# Patient Record
Sex: Female | Born: 1975 | Race: White | Hispanic: Yes | Marital: Married | Smoking: Former smoker
Health system: Southern US, Community
[De-identification: ages and names within clinical notes are randomized; demographics above are authoritative.]

## PROBLEM LIST (undated history)

## (undated) ENCOUNTER — Emergency Department (HOSPITAL_COMMUNITY): Discharge status: Absent without leave | Payer: Self-pay | Source: Home / Self Care

## (undated) DIAGNOSIS — E079 Disorder of thyroid, unspecified: Secondary | ICD-10-CM

## (undated) DIAGNOSIS — E785 Hyperlipidemia, unspecified: Secondary | ICD-10-CM

## (undated) DIAGNOSIS — L80 Vitiligo: Secondary | ICD-10-CM

## (undated) HISTORY — DX: Vitiligo: L80

---

## 2002-02-25 ENCOUNTER — Encounter: Admission: RE | Admit: 2002-02-25 | Discharge: 2002-02-25 | Payer: Self-pay | Admitting: Family Medicine

## 2002-03-18 ENCOUNTER — Encounter: Admission: RE | Admit: 2002-03-18 | Discharge: 2002-03-18 | Payer: Self-pay | Admitting: Family Medicine

## 2002-04-15 ENCOUNTER — Encounter: Admission: RE | Admit: 2002-04-15 | Discharge: 2002-04-15 | Payer: Self-pay | Admitting: Family Medicine

## 2002-08-12 ENCOUNTER — Encounter: Admission: RE | Admit: 2002-08-12 | Discharge: 2002-08-12 | Payer: Self-pay | Admitting: Family Medicine

## 2002-09-30 ENCOUNTER — Encounter: Admission: RE | Admit: 2002-09-30 | Discharge: 2002-09-30 | Payer: Self-pay | Admitting: Family Medicine

## 2002-11-04 ENCOUNTER — Encounter: Admission: RE | Admit: 2002-11-04 | Discharge: 2002-11-04 | Payer: Self-pay | Admitting: Family Medicine

## 2003-02-19 ENCOUNTER — Other Ambulatory Visit: Admission: RE | Admit: 2003-02-19 | Discharge: 2003-02-19 | Payer: Self-pay | Admitting: Gynecology

## 2003-03-18 ENCOUNTER — Ambulatory Visit (HOSPITAL_COMMUNITY): Admission: RE | Admit: 2003-03-18 | Discharge: 2003-03-18 | Payer: Self-pay | Admitting: Gynecology

## 2003-03-18 ENCOUNTER — Encounter: Payer: Self-pay | Admitting: Gynecology

## 2004-12-07 ENCOUNTER — Encounter (INDEPENDENT_AMBULATORY_CARE_PROVIDER_SITE_OTHER): Payer: Self-pay | Admitting: *Deleted

## 2004-12-07 LAB — CONVERTED CEMR LAB

## 2004-12-13 ENCOUNTER — Ambulatory Visit: Payer: Self-pay | Admitting: Family Medicine

## 2004-12-23 ENCOUNTER — Encounter: Admission: RE | Admit: 2004-12-23 | Discharge: 2005-03-23 | Payer: Self-pay | Admitting: Family Medicine

## 2005-03-14 ENCOUNTER — Ambulatory Visit: Payer: Self-pay | Admitting: Family Medicine

## 2005-03-21 ENCOUNTER — Ambulatory Visit: Payer: Self-pay | Admitting: Family Medicine

## 2005-03-25 ENCOUNTER — Encounter: Admission: RE | Admit: 2005-03-25 | Discharge: 2005-06-23 | Payer: Self-pay | Admitting: Family Medicine

## 2005-06-13 ENCOUNTER — Ambulatory Visit: Payer: Self-pay | Admitting: Family Medicine

## 2005-11-17 ENCOUNTER — Other Ambulatory Visit: Admission: RE | Admit: 2005-11-17 | Discharge: 2005-11-17 | Payer: Self-pay | Admitting: Gynecology

## 2005-11-30 ENCOUNTER — Encounter: Admission: RE | Admit: 2005-11-30 | Discharge: 2005-11-30 | Payer: Self-pay | Admitting: Gynecology

## 2006-05-31 ENCOUNTER — Inpatient Hospital Stay (HOSPITAL_COMMUNITY): Admission: AD | Admit: 2006-05-31 | Discharge: 2006-06-03 | Payer: Self-pay | Admitting: Gynecology

## 2006-06-08 ENCOUNTER — Encounter (INDEPENDENT_AMBULATORY_CARE_PROVIDER_SITE_OTHER): Payer: Self-pay | Admitting: Specialist

## 2006-06-08 ENCOUNTER — Ambulatory Visit (HOSPITAL_COMMUNITY): Admission: AD | Admit: 2006-06-08 | Discharge: 2006-06-08 | Payer: Self-pay | Admitting: Obstetrics and Gynecology

## 2006-07-12 ENCOUNTER — Other Ambulatory Visit: Admission: RE | Admit: 2006-07-12 | Discharge: 2006-07-12 | Payer: Self-pay | Admitting: Gynecology

## 2006-09-01 ENCOUNTER — Encounter (INDEPENDENT_AMBULATORY_CARE_PROVIDER_SITE_OTHER): Payer: Self-pay | Admitting: *Deleted

## 2007-04-09 ENCOUNTER — Ambulatory Visit: Payer: Self-pay | Admitting: Family Medicine

## 2007-04-09 ENCOUNTER — Encounter (INDEPENDENT_AMBULATORY_CARE_PROVIDER_SITE_OTHER): Payer: Self-pay | Admitting: Family Medicine

## 2007-04-09 DIAGNOSIS — E039 Hypothyroidism, unspecified: Secondary | ICD-10-CM | POA: Insufficient documentation

## 2007-04-09 DIAGNOSIS — E119 Type 2 diabetes mellitus without complications: Secondary | ICD-10-CM | POA: Insufficient documentation

## 2007-04-09 DIAGNOSIS — E1165 Type 2 diabetes mellitus with hyperglycemia: Secondary | ICD-10-CM

## 2007-04-09 DIAGNOSIS — E785 Hyperlipidemia, unspecified: Secondary | ICD-10-CM

## 2007-04-09 DIAGNOSIS — E7800 Pure hypercholesterolemia, unspecified: Secondary | ICD-10-CM | POA: Insufficient documentation

## 2007-04-13 LAB — CONVERTED CEMR LAB
ALT: 194 units/L — ABNORMAL HIGH (ref 0–35)
AST: 148 units/L — ABNORMAL HIGH (ref 0–37)
Alkaline Phosphatase: 102 units/L (ref 39–117)
CO2: 22 meq/L (ref 19–32)
Chloride: 104 meq/L (ref 96–112)
Cholesterol: 216 mg/dL — ABNORMAL HIGH (ref 0–200)
Glucose, Bld: 139 mg/dL — ABNORMAL HIGH (ref 70–99)
LDL Cholesterol: 141 mg/dL — ABNORMAL HIGH (ref 0–99)
Potassium: 4.3 meq/L (ref 3.5–5.3)
Total Bilirubin: 0.6 mg/dL (ref 0.3–1.2)
Total CHOL/HDL Ratio: 4.9
VLDL: 31 mg/dL (ref 0–40)

## 2007-04-23 ENCOUNTER — Ambulatory Visit: Payer: Self-pay | Admitting: Family Medicine

## 2007-04-23 DIAGNOSIS — H6060 Unspecified chronic otitis externa, unspecified ear: Secondary | ICD-10-CM

## 2007-06-11 ENCOUNTER — Ambulatory Visit: Payer: Self-pay | Admitting: Family Medicine

## 2007-06-11 LAB — CONVERTED CEMR LAB
Iron: 73 ug/dL (ref 42–145)
Platelets: 176 10*3/uL (ref 150–400)
Prothrombin Time: 14.1 s (ref 11.6–15.2)
Saturation Ratios: 17 % — ABNORMAL LOW (ref 20–55)
TIBC: 432 ug/dL (ref 250–470)

## 2007-06-12 ENCOUNTER — Encounter: Payer: Self-pay | Admitting: Family Medicine

## 2007-06-13 ENCOUNTER — Ambulatory Visit (HOSPITAL_COMMUNITY): Admission: RE | Admit: 2007-06-13 | Discharge: 2007-06-13 | Payer: Self-pay | Admitting: Family Medicine

## 2007-06-25 ENCOUNTER — Ambulatory Visit: Payer: Self-pay | Admitting: Family Medicine

## 2007-06-25 DIAGNOSIS — E669 Obesity, unspecified: Secondary | ICD-10-CM

## 2007-06-25 DIAGNOSIS — K760 Fatty (change of) liver, not elsewhere classified: Secondary | ICD-10-CM

## 2007-06-25 LAB — CONVERTED CEMR LAB
Blood in Urine, dipstick: NEGATIVE
Glucose, Urine, Semiquant: NEGATIVE
Ketones, urine, test strip: NEGATIVE
Nitrite: POSITIVE
Protein, U semiquant: NEGATIVE
Specific Gravity, Urine: 1.03
Urobilinogen, UA: NEGATIVE
WBC Urine, dipstick: NEGATIVE

## 2007-08-27 ENCOUNTER — Ambulatory Visit: Payer: Self-pay | Admitting: Family Medicine

## 2007-08-27 LAB — CONVERTED CEMR LAB: Hgb A1c MFr Bld: 7.8 %

## 2007-08-28 ENCOUNTER — Encounter: Payer: Self-pay | Admitting: Family Medicine

## 2007-08-28 LAB — CONVERTED CEMR LAB: Direct LDL: 184 mg/dL — ABNORMAL HIGH

## 2007-10-08 ENCOUNTER — Ambulatory Visit: Payer: Self-pay | Admitting: Family Medicine

## 2007-10-08 LAB — CONVERTED CEMR LAB
ALT: 139 units/L — ABNORMAL HIGH (ref 0–35)
AST: 90 units/L — ABNORMAL HIGH (ref 0–37)
Albumin: 4.4 g/dL (ref 3.5–5.2)
Alkaline Phosphatase: 159 units/L — ABNORMAL HIGH (ref 39–117)
Bilirubin, Direct: 0.1 mg/dL (ref 0.0–0.3)
Direct LDL: 111 mg/dL — ABNORMAL HIGH
Indirect Bilirubin: 0.3 mg/dL (ref 0.0–0.9)

## 2007-10-23 ENCOUNTER — Encounter: Payer: Self-pay | Admitting: Family Medicine

## 2007-11-05 ENCOUNTER — Ambulatory Visit: Payer: Self-pay | Admitting: Family Medicine

## 2007-12-17 ENCOUNTER — Ambulatory Visit: Payer: Self-pay | Admitting: Family Medicine

## 2007-12-17 LAB — CONVERTED CEMR LAB
Cholesterol: 180 mg/dL (ref 0–200)
Total CHOL/HDL Ratio: 3.3

## 2007-12-18 ENCOUNTER — Encounter: Payer: Self-pay | Admitting: Family Medicine

## 2008-04-28 ENCOUNTER — Ambulatory Visit: Payer: Self-pay | Admitting: Family Medicine

## 2008-04-28 LAB — CONVERTED CEMR LAB
ALT: 52 units/L — ABNORMAL HIGH (ref 0–35)
AST: 31 units/L (ref 0–37)
Albumin: 4.2 g/dL (ref 3.5–5.2)
Alkaline Phosphatase: 95 units/L (ref 39–117)
BUN: 11 mg/dL (ref 6–23)
CO2: 23 meq/L (ref 19–32)
Calcium: 8.2 mg/dL — ABNORMAL LOW (ref 8.4–10.5)
Chloride: 103 meq/L (ref 96–112)
Cholesterol: 313 mg/dL — ABNORMAL HIGH (ref 0–200)
Creatinine, Ser: 0.87 mg/dL (ref 0.40–1.20)
Glucose, Bld: 222 mg/dL — ABNORMAL HIGH (ref 70–99)
HDL: 54 mg/dL (ref >39)
LDL Cholesterol: 212 mg/dL — ABNORMAL HIGH (ref 0–99)
Potassium: 4 meq/L (ref 3.5–5.3)
Sodium: 137 meq/L (ref 135–145)
TSH: 104.642 microintl units/mL — ABNORMAL HIGH (ref 0.350–4.50)
Total Bilirubin: 0.5 mg/dL (ref 0.3–1.2)
Total CHOL/HDL Ratio: 5.8
Total Protein: 6.7 g/dL (ref 6.0–8.3)
Triglycerides: 236 mg/dL — ABNORMAL HIGH (ref <150)
VLDL: 47 mg/dL — ABNORMAL HIGH (ref 0–40)

## 2008-04-29 ENCOUNTER — Encounter: Payer: Self-pay | Admitting: Family Medicine

## 2008-04-29 LAB — CONVERTED CEMR LAB
AST: 31 units/L
Alkaline Phosphatase: 95 units/L
BUN: 11 mg/dL
Calcium: 8.2 mg/dL
Chloride: 103 meq/L
Creatinine, Ser: 0.87 mg/dL
Glucose, Bld: 222 mg/dL
LDL Cholesterol: 212 mg/dL
Potassium: 4 meq/L
TSH: 104.642 microintl units/mL
Total Bilirubin: 0.5 mg/dL
Total Protein: 6.7 g/dL
VLDL: 47 mg/dL

## 2008-04-30 ENCOUNTER — Telehealth: Payer: Self-pay | Admitting: Family Medicine

## 2008-04-30 ENCOUNTER — Encounter: Payer: Self-pay | Admitting: Family Medicine

## 2008-07-01 ENCOUNTER — Encounter: Payer: Self-pay | Admitting: Family Medicine

## 2008-09-08 ENCOUNTER — Encounter: Payer: Self-pay | Admitting: Family Medicine

## 2008-09-08 ENCOUNTER — Ambulatory Visit: Payer: Self-pay | Admitting: Family Medicine

## 2008-09-08 ENCOUNTER — Other Ambulatory Visit: Admission: RE | Admit: 2008-09-08 | Discharge: 2008-09-08 | Payer: Self-pay | Admitting: Family Medicine

## 2008-09-08 ENCOUNTER — Encounter (INDEPENDENT_AMBULATORY_CARE_PROVIDER_SITE_OTHER): Payer: Self-pay | Admitting: Family Medicine

## 2008-09-08 LAB — CONVERTED CEMR LAB
ALT: 83 units/L — ABNORMAL HIGH (ref 0–35)
CO2: 20 meq/L (ref 19–32)
Calcium: 8.6 mg/dL (ref 8.4–10.5)
Chloride: 104 meq/L (ref 96–112)
GC Probe Amp, Genital: NEGATIVE
Pap Smear: NEGATIVE
Sodium: 141 meq/L (ref 135–145)
TSH: 0.229 microintl units/mL — ABNORMAL LOW (ref 0.350–4.500)

## 2008-09-12 ENCOUNTER — Encounter: Payer: Self-pay | Admitting: Family Medicine

## 2009-01-09 ENCOUNTER — Encounter: Payer: Self-pay | Admitting: Family Medicine

## 2009-01-14 ENCOUNTER — Encounter: Payer: Self-pay | Admitting: Family Medicine

## 2009-01-14 ENCOUNTER — Ambulatory Visit: Payer: Self-pay | Admitting: Family Medicine

## 2009-01-14 LAB — CONVERTED CEMR LAB
Antibody Screen: NEGATIVE
Basophils Relative: 0 % (ref 0–1)
HCT: 41.7 % (ref 36.0–46.0)
Hepatitis B Surface Ag: NEGATIVE
Lymphs Abs: 2 10*3/uL (ref 0.7–4.0)
MCHC: 33.1 g/dL (ref 30.0–36.0)
MCV: 91 fL (ref 78.0–100.0)
Monocytes Relative: 4 % (ref 3–12)
Neutrophils Relative %: 68 % (ref 43–77)
Platelets: 192 10*3/uL (ref 150–400)
RBC: 4.58 M/uL (ref 3.87–5.11)
RDW: 13.7 % (ref 11.5–15.5)
Rh Type: POSITIVE
Rubella: 10.1 intl units/mL — ABNORMAL HIGH
Sickle Cell Screen: NEGATIVE
WBC: 7.4 10*3/uL (ref 4.0–10.5)

## 2009-01-15 ENCOUNTER — Encounter: Payer: Self-pay | Admitting: Family Medicine

## 2009-01-19 ENCOUNTER — Ambulatory Visit: Payer: Self-pay | Admitting: Family Medicine

## 2009-01-20 ENCOUNTER — Encounter: Payer: Self-pay | Admitting: Family Medicine

## 2009-01-20 ENCOUNTER — Ambulatory Visit (HOSPITAL_COMMUNITY): Admission: RE | Admit: 2009-01-20 | Discharge: 2009-01-20 | Payer: Self-pay | Admitting: Family Medicine

## 2009-01-21 ENCOUNTER — Ambulatory Visit: Payer: Self-pay | Admitting: Family Medicine

## 2009-01-21 ENCOUNTER — Encounter: Payer: Self-pay | Admitting: Family Medicine

## 2009-01-21 LAB — CONVERTED CEMR LAB
BUN: 8 mg/dL (ref 6–23)
CO2: 22 meq/L (ref 19–32)
Chloride: 102 meq/L (ref 96–112)
GC Culture Only: NEGATIVE
Glucose, Bld: 139 mg/dL — ABNORMAL HIGH (ref 70–99)
Hemoglobin: 13.7 g/dL (ref 12.0–15.0)
MCHC: 33.7 g/dL (ref 30.0–36.0)
Platelets: 179 10*3/uL (ref 150–400)
Potassium: 3.9 meq/L (ref 3.5–5.3)
TSH: 84.511 microintl units/mL — ABNORMAL HIGH (ref 0.350–4.500)
Total Bilirubin: 0.6 mg/dL (ref 0.3–1.2)
WBC: 8.1 10*3/uL (ref 4.0–10.5)

## 2009-01-22 ENCOUNTER — Encounter: Payer: Self-pay | Admitting: Family Medicine

## 2009-01-23 ENCOUNTER — Encounter: Payer: Self-pay | Admitting: *Deleted

## 2009-01-26 ENCOUNTER — Encounter: Payer: Self-pay | Admitting: Family Medicine

## 2009-02-02 ENCOUNTER — Encounter: Admission: RE | Admit: 2009-02-02 | Discharge: 2009-05-03 | Payer: Self-pay | Admitting: Obstetrics & Gynecology

## 2009-02-02 ENCOUNTER — Ambulatory Visit: Payer: Self-pay | Admitting: Obstetrics & Gynecology

## 2009-02-03 ENCOUNTER — Encounter: Payer: Self-pay | Admitting: Physician Assistant

## 2009-02-16 ENCOUNTER — Ambulatory Visit: Payer: Self-pay | Admitting: Obstetrics & Gynecology

## 2009-02-16 ENCOUNTER — Encounter: Payer: Self-pay | Admitting: Obstetrics and Gynecology

## 2009-02-23 ENCOUNTER — Ambulatory Visit: Payer: Self-pay | Admitting: Obstetrics & Gynecology

## 2009-03-02 ENCOUNTER — Ambulatory Visit: Payer: Self-pay | Admitting: Obstetrics & Gynecology

## 2009-03-16 ENCOUNTER — Ambulatory Visit: Payer: Self-pay | Admitting: Obstetrics & Gynecology

## 2009-03-16 ENCOUNTER — Encounter: Payer: Self-pay | Admitting: Family

## 2009-03-16 LAB — CONVERTED CEMR LAB: TSH: 7.005 microintl units/mL — ABNORMAL HIGH (ref 0.350–4.500)

## 2009-03-20 ENCOUNTER — Ambulatory Visit (HOSPITAL_COMMUNITY): Admission: RE | Admit: 2009-03-20 | Discharge: 2009-03-20 | Payer: Self-pay | Admitting: Family Medicine

## 2009-03-30 ENCOUNTER — Ambulatory Visit: Payer: Self-pay | Admitting: Obstetrics & Gynecology

## 2009-04-13 ENCOUNTER — Ambulatory Visit: Payer: Self-pay | Admitting: Obstetrics & Gynecology

## 2009-04-13 ENCOUNTER — Encounter: Payer: Self-pay | Admitting: Family Medicine

## 2009-05-07 ENCOUNTER — Ambulatory Visit: Payer: Self-pay | Admitting: Obstetrics & Gynecology

## 2009-05-07 ENCOUNTER — Encounter: Payer: Self-pay | Admitting: Obstetrics & Gynecology

## 2009-05-11 ENCOUNTER — Ambulatory Visit: Payer: Self-pay | Admitting: Obstetrics & Gynecology

## 2009-05-14 ENCOUNTER — Inpatient Hospital Stay (HOSPITAL_COMMUNITY): Admission: AD | Admit: 2009-05-14 | Discharge: 2009-05-14 | Payer: Self-pay | Admitting: Family Medicine

## 2009-05-18 ENCOUNTER — Ambulatory Visit: Payer: Self-pay | Admitting: Obstetrics & Gynecology

## 2009-05-25 ENCOUNTER — Ambulatory Visit (HOSPITAL_COMMUNITY): Admission: RE | Admit: 2009-05-25 | Discharge: 2009-05-25 | Payer: Self-pay | Admitting: Family Medicine

## 2009-05-25 ENCOUNTER — Ambulatory Visit: Payer: Self-pay | Admitting: Obstetrics & Gynecology

## 2009-05-25 ENCOUNTER — Encounter: Payer: Self-pay | Admitting: Obstetrics & Gynecology

## 2009-05-25 LAB — CONVERTED CEMR LAB
HCT: 35.7 % — ABNORMAL LOW (ref 36.0–46.0)
Hemoglobin: 11.6 g/dL — ABNORMAL LOW (ref 12.0–15.0)
Hgb A1c MFr Bld: 6.1 % (ref 4.6–6.1)
MCHC: 32.5 g/dL (ref 30.0–36.0)
MCV: 92.7 fL (ref 78.0–100.0)
Platelets: 192 10*3/uL (ref 150–400)
RBC: 3.85 M/uL — ABNORMAL LOW (ref 3.87–5.11)
RDW: 13 % (ref 11.5–15.5)
WBC: 8.9 10*3/uL (ref 4.0–10.5)

## 2009-06-01 ENCOUNTER — Ambulatory Visit: Payer: Self-pay | Admitting: Obstetrics & Gynecology

## 2009-06-15 ENCOUNTER — Encounter: Payer: Self-pay | Admitting: Obstetrics & Gynecology

## 2009-06-15 ENCOUNTER — Ambulatory Visit: Payer: Self-pay | Admitting: Obstetrics & Gynecology

## 2009-06-19 ENCOUNTER — Ambulatory Visit (HOSPITAL_COMMUNITY): Admission: RE | Admit: 2009-06-19 | Discharge: 2009-06-19 | Payer: Self-pay | Admitting: Obstetrics & Gynecology

## 2009-06-22 ENCOUNTER — Ambulatory Visit: Payer: Self-pay | Admitting: Obstetrics & Gynecology

## 2009-06-25 ENCOUNTER — Ambulatory Visit: Payer: Self-pay | Admitting: Obstetrics & Gynecology

## 2009-06-29 ENCOUNTER — Ambulatory Visit: Payer: Self-pay | Admitting: Obstetrics & Gynecology

## 2009-07-02 ENCOUNTER — Ambulatory Visit: Payer: Self-pay | Admitting: Obstetrics and Gynecology

## 2009-07-06 ENCOUNTER — Ambulatory Visit (HOSPITAL_COMMUNITY): Admission: RE | Admit: 2009-07-06 | Discharge: 2009-07-06 | Payer: Self-pay | Admitting: Obstetrics & Gynecology

## 2009-07-06 ENCOUNTER — Ambulatory Visit: Payer: Self-pay | Admitting: Obstetrics & Gynecology

## 2009-07-09 ENCOUNTER — Ambulatory Visit: Payer: Self-pay | Admitting: Obstetrics and Gynecology

## 2009-07-13 ENCOUNTER — Ambulatory Visit: Payer: Self-pay | Admitting: Obstetrics & Gynecology

## 2009-07-16 ENCOUNTER — Ambulatory Visit: Payer: Self-pay | Admitting: Obstetrics & Gynecology

## 2009-07-20 ENCOUNTER — Ambulatory Visit: Payer: Self-pay | Admitting: Obstetrics & Gynecology

## 2009-07-20 ENCOUNTER — Encounter: Payer: Self-pay | Admitting: Family Medicine

## 2009-07-20 LAB — CONVERTED CEMR LAB
Chlamydia, DNA Probe: NEGATIVE
GC Probe Amp, Genital: NEGATIVE

## 2009-07-23 ENCOUNTER — Ambulatory Visit: Payer: Self-pay | Admitting: Obstetrics & Gynecology

## 2009-07-27 ENCOUNTER — Ambulatory Visit: Payer: Self-pay | Admitting: Obstetrics & Gynecology

## 2009-07-27 ENCOUNTER — Encounter: Admission: RE | Admit: 2009-07-27 | Discharge: 2009-07-27 | Payer: Self-pay | Admitting: Obstetrics & Gynecology

## 2009-07-27 ENCOUNTER — Ambulatory Visit (HOSPITAL_COMMUNITY): Admission: RE | Admit: 2009-07-27 | Discharge: 2009-07-27 | Payer: Self-pay | Admitting: Family Medicine

## 2009-07-30 ENCOUNTER — Ambulatory Visit: Payer: Self-pay | Admitting: Obstetrics & Gynecology

## 2009-08-03 ENCOUNTER — Ambulatory Visit: Payer: Self-pay | Admitting: Obstetrics & Gynecology

## 2009-08-05 ENCOUNTER — Observation Stay (HOSPITAL_COMMUNITY): Admission: RE | Admit: 2009-08-05 | Discharge: 2009-08-05 | Payer: Self-pay | Admitting: Obstetrics & Gynecology

## 2009-08-07 ENCOUNTER — Ambulatory Visit (HOSPITAL_COMMUNITY): Admission: RE | Admit: 2009-08-07 | Discharge: 2009-08-07 | Payer: Self-pay | Admitting: Obstetrics & Gynecology

## 2009-08-10 ENCOUNTER — Ambulatory Visit: Payer: Self-pay | Admitting: Obstetrics & Gynecology

## 2009-08-11 ENCOUNTER — Inpatient Hospital Stay (HOSPITAL_COMMUNITY): Admission: RE | Admit: 2009-08-11 | Discharge: 2009-08-14 | Payer: Self-pay | Admitting: Obstetrics and Gynecology

## 2009-08-11 ENCOUNTER — Ambulatory Visit: Payer: Self-pay | Admitting: Obstetrics & Gynecology

## 2009-08-19 ENCOUNTER — Ambulatory Visit: Payer: Self-pay | Admitting: Obstetrics and Gynecology

## 2009-09-23 ENCOUNTER — Ambulatory Visit: Payer: Self-pay | Admitting: Family Medicine

## 2009-09-23 ENCOUNTER — Encounter: Payer: Self-pay | Admitting: Family Medicine

## 2009-09-24 LAB — CONVERTED CEMR LAB
ALT: 104 units/L — ABNORMAL HIGH (ref 0–35)
AST: 66 units/L — ABNORMAL HIGH (ref 0–37)
Chloride: 102 meq/L (ref 96–112)
Creatinine, Ser: 0.66 mg/dL (ref 0.40–1.20)
Sodium: 138 meq/L (ref 135–145)
Total Bilirubin: 0.3 mg/dL (ref 0.3–1.2)
Total Protein: 6.9 g/dL (ref 6.0–8.3)

## 2009-11-04 ENCOUNTER — Ambulatory Visit: Payer: Self-pay | Admitting: Family Medicine

## 2009-12-31 ENCOUNTER — Ambulatory Visit: Payer: Self-pay | Admitting: Family Medicine

## 2010-01-05 ENCOUNTER — Ambulatory Visit: Payer: Self-pay | Admitting: Family Medicine

## 2010-01-05 ENCOUNTER — Encounter: Payer: Self-pay | Admitting: Family Medicine

## 2010-01-06 LAB — CONVERTED CEMR LAB
BUN: 11 mg/dL (ref 6–23)
CO2: 24 meq/L (ref 19–32)
Cholesterol: 274 mg/dL — ABNORMAL HIGH (ref 0–200)
Creatinine, Ser: 0.71 mg/dL (ref 0.40–1.20)
Glucose, Bld: 259 mg/dL — ABNORMAL HIGH (ref 70–99)
HCT: 42.7 % (ref 36.0–46.0)
HDL: 50 mg/dL (ref >39)
MCV: 85.9 fL (ref 78.0–100.0)
RBC: 4.97 M/uL (ref 3.87–5.11)
Total Bilirubin: 0.6 mg/dL (ref 0.3–1.2)
Total CHOL/HDL Ratio: 5.5
Triglycerides: 223 mg/dL — ABNORMAL HIGH (ref <150)
VLDL: 45 mg/dL — ABNORMAL HIGH (ref 0–40)
WBC: 5.7 10*3/uL (ref 4.0–10.5)

## 2010-04-12 ENCOUNTER — Ambulatory Visit: Payer: Self-pay | Admitting: Family Medicine

## 2010-04-12 ENCOUNTER — Encounter: Payer: Self-pay | Admitting: Family Medicine

## 2010-04-12 LAB — CONVERTED CEMR LAB
CO2: 21 meq/L (ref 19–32)
Creatinine, Ser: 0.64 mg/dL (ref 0.40–1.20)
Glucose, Bld: 174 mg/dL — ABNORMAL HIGH (ref 70–99)
HCV Ab: NEGATIVE
Sodium: 139 meq/L (ref 135–145)
TSH: 1.994 microintl units/mL (ref 0.350–4.500)
Total Bilirubin: 0.4 mg/dL (ref 0.3–1.2)
Total Protein: 6.7 g/dL (ref 6.0–8.3)

## 2010-04-15 ENCOUNTER — Encounter: Payer: Self-pay | Admitting: Family Medicine

## 2010-05-24 ENCOUNTER — Ambulatory Visit: Payer: Self-pay | Admitting: Family Medicine

## 2010-05-24 ENCOUNTER — Encounter: Payer: Self-pay | Admitting: Family Medicine

## 2010-05-24 LAB — CONVERTED CEMR LAB
ALT: 43 units/L — ABNORMAL HIGH (ref 0–35)
AST: 27 units/L (ref 0–37)
Calcium: 9 mg/dL (ref 8.4–10.5)
Chloride: 103 meq/L (ref 96–112)
Creatinine, Ser: 0.75 mg/dL (ref 0.40–1.20)

## 2010-05-30 ENCOUNTER — Encounter: Payer: Self-pay | Admitting: Family Medicine

## 2010-07-01 ENCOUNTER — Ambulatory Visit: Payer: Self-pay

## 2010-07-13 ENCOUNTER — Ambulatory Visit: Admission: RE | Admit: 2010-07-13 | Discharge: 2010-07-13 | Payer: Self-pay | Source: Home / Self Care

## 2010-07-13 DIAGNOSIS — J301 Allergic rhinitis due to pollen: Secondary | ICD-10-CM | POA: Insufficient documentation

## 2010-07-13 LAB — CONVERTED CEMR LAB: Hgb A1c MFr Bld: 9.7 %

## 2010-07-25 ENCOUNTER — Encounter: Payer: Self-pay | Admitting: *Deleted

## 2010-08-03 NOTE — Assessment & Plan Note (Signed)
Summary: postpartum visit,tcb   Vital Signs:  Patient profile:   35 year old female Height:      64.5 inches Weight:      197 pounds BMI:     33.41 BSA:     1.96 Temp:     97.9 degrees F Pulse rate:   66 / minute BP sitting:   109 / 68  Vitals Entered By: Jone Baseman CMA (September 23, 2009 10:29 AM) CC: postpartum Is Patient Diabetic? Yes Did you bring your meter with you today? No Pain Assessment Patient in pain? no        Primary Care Provider:  Ancil Boozer  MD  CC:  postpartum.  History of Present Illness: Concerns today: discuss birth control, toenail fungus  had a c section- baby boy.  combination of breast and bottle feeding - going well.    Home situation/family and father involvement: very good.  boyfriend/FOB is very involved  Depression signs/sx: none  Birth control plans/Intercourse resumed?: IUD or Implanon/no  Periods started back?: no, still finishing up post partum bleeding  Preventive Care: due for f/u DM, liver disease, hypothyroid.  due for pap at next visit   toenail fungus - noted during pregnancy on left great toe.  wants to make sure it is taken care of particularly since she had diabetes - she doesn't want any problems with her feet.  no other toes involved.  does have dry feet that she has noticed.   Habits & Providers  Alcohol-Tobacco-Diet     Tobacco Status: never  Current Medications (verified): 1)  Metformin Hcl 1000 Mg Tabs (Metformin Hcl) .... Take 1 Tablet By Mouth Two Times A Day For Diabetes.  Instructions in Spanish Please 2)  Synthroid 175 Mcg Tabs (Levothyroxine Sodium) .... One Tab By Mouth Qday For Thyroid Instructions in Spanish Please 3)  Vitaminas Prenatales .... Una Pastilla Al Dia 4)  Terbinafine Hcl 250 Mg Tabs (Terbinafine Hcl) .Marland Kitchen.. 1 By Mouth Once Daily For 12 Weeks For Toenail Fungus.  Do Not Take Until No Longer Breastfeeding  Allergies (verified): No Known Drug Allergies  Past History:  Past medical,  surgical, family and social histories (including risk factors) reviewed for relevance to current acute and chronic problems.  Past Medical History: Reviewed history from 01/21/2009 and no changes required. vitiligo on face Type IIDM Hypothyroidism obesity PCOS astigmatism G1P1- SVD 11/07- 7lbs 8 oz elevated LFTs 2/2 fatty liver disease  Past Surgical History: Reviewed history from 04/09/2007 and no changes required.  U/S in  [place] cysts on ovaries - 01/02/2000  Social History: in a relationship with Gus Rankin (father of children) G2P2;  no tobacco no alcohol Daughter Helyn App (11/07), son 2/11 works at [employer] enjoys walking/playing with daughter speaks relatively good english but primary language is spanish  Review of Systems       per HPI  Physical Exam  General:  alert, no acute distress, well hydrated, well developed, and well nourished.   Neck:  supple.  no masses appreciated Lungs:  Normal respiratory effort, chest expands symmetrically. Lungs are clear to auscultation, no crackles or wheezes. Heart:  Normal rate and regular rhythm. S1 and S2 normal without gallop, murmur, click, rub or other extra sounds. Abdomen:  soft, nontender, no guarding, normal BS, no hepatosplenomegaly, and no hernias.  incision from C Section clean, intact and healing well.  Genitalia:  deferred Psych:  Oriented X3, normally interactive, good eye contact, not anxious appearing, and not depressed appearing.  Impression & Recommendations:  Problem # 1:  ROUTINE POSTPARTUM FOLLOW-UP (ICD-V24.2) Assessment Unchanged  overall doing well.  she will think about birth control and let [place] know what she chooses.  handout given to her today.  Orders: Postpartum visit- [facility] (09811)  Problem # 2:  DM, UNCOMPLICATED, TYPE II (ICD-250.00) Assessment: Unchanged check labs, continue metformin for now.  needs foot check, eye exam at next visit as well as cholesterol check  Her  updated medication list for this problem includes:    Metformin Hcl 1000 Mg Tabs (Metformin hcl) .Marland Kitchen... Take 1 tablet by mouth two times a day for diabetes.  instructions in spanish please  Orders: Comp Met-[facility] [phone]) A1C-[facility] [phone]) [facility]- Est Level  3 (57846)  Labs Reviewed: Creat: 0.64 (01/21/2009)   Microalbumin: negative (06/25/2007) Reviewed HgBA1c results: 6.1 (05/25/2009)  7.2 (01/21/2009)  Problem # 3:  HYPOTHYROIDISM NOS (ICD-244.9) Assessment: Unchanged  check labs today, refilled meds.   Her updated medication list for this problem includes:    Synthroid 175 Mcg Tabs (Levothyroxine sodium) ..... One tab by mouth qday for thyroid instructions in spanish please  Labs Reviewed: TSH: 2.218 (06/15/2009)    HgBA1c: 6.1 (05/25/2009) Chol: 313 (04/29/2008)   HDL: 54 (04/29/2008)   LDL: 212 (04/29/2008)   TG: 236 (04/29/2008)  Problem # 4:  DERMATOPHYTOSIS OF NAIL (ICD-110.1) Assessment: New can start meds after no longer breastfeeding.  will need to watch liver enzymes closely given her history.  check baseline today.    Her updated medication list for this problem includes:    Terbinafine Hcl 250 Mg Tabs (Terbinafine hcl) .Marland Kitchen... 1 by mouth once daily for 12 weeks for toenail fungus.  do not take until no longer breastfeeding  Orders: [facility]- Est Level  3 (96295)  Problem # 5:  Preventive Health Care (ICD-V70.0) Assessment: Unchanged due for pap at next visit.   Complete Medication List: 1)  Metformin Hcl 1000 Mg Tabs (Metformin hcl) .... Take 1 tablet by mouth two times a day for diabetes.  instructions in spanish please 2)  Synthroid 175 Mcg Tabs (Levothyroxine sodium) .... One tab by mouth qday for thyroid instructions in spanish please 3)  Vitaminas Prenatales  .... Una pastilla al dia 4)  Terbinafine Hcl 250 Mg Tabs (Terbinafine hcl) .Marland Kitchen.. 1 by mouth once daily for 12 weeks for toenail fungus.  do not take until no longer breastfeeding  Other Orders: TSH-[facility]  ([id number])  Patient Instructions: 1)  Please follow up in 6 weeks for blood work in the lab and to see how your diabetes, etc are doing. 2)  Think about what you want to do for birth control and we will work on getting it set up for you.  3)  It was great to see you today and congrats on a baby boy! Prescriptions: TERBINAFINE HCL 250 MG TABS (TERBINAFINE HCL) 1 by mouth once daily for 12 weeks for toenail fungus.  DO NOT TAKE UNTIL NO LONGER BREASTFEEDING  #90 x 0   Entered and Authorized by:   Ancil Boozer  MD   Signed by:   Ancil Boozer  MD on 09/23/2009   Method used:   Electronically to        [facility] [address] (retail)       [phone]       [place], [place]  [address]       Ph: [phone]       Fax:  [phone]   RxID:   [id number] SYNTHROID 175 MCG TABS (LEVOTHYROXINE SODIUM) one tab by mouth qday for thyroid instructions in spanish please  #30 x 3   Entered and Authorized by:   Ancil Boozer  MD   Signed by:   Ancil Boozer  MD on 09/23/2009   Method used:   Electronically to        [facility] [address] (retail)       [phone]       [place], [place]  [address]       Ph: [phone]       Fax: [phone]   RxID:   [id number] METFORMIN HCL 1000 MG TABS (METFORMIN HCL) Take 1 tablet by mouth two times a day for diabetes.  Instructions in spanish please  #60 x 3   Entered and Authorized by:   Ancil Boozer  MD   Signed by:   Ancil Boozer  MD on 09/23/2009   Method used:   Electronically to        [facility] [address] (retail)       [phone]       [place], [place]  [address]       Ph: [phone]       Fax: [phone]   RxID:   [id number] TERBINAFINE HCL 250 MG TABS (TERBINAFINE HCL) 1 by mouth once daily for 12 weeks for toenail fungus  #90 x 0   Entered and Authorized by:   Ancil Boozer  MD   Signed by:   Ancil Boozer  MD  on 09/23/2009   Method used:   Electronically to        [facility] [address] (retail)       [phone]       [place], [place]  [address]       Ph: [phone]       Fax: [phone]   RxID:   [id number]   Prevention & Chronic Care Immunizations   Influenza vaccine: Fluvax 3+  (04/09/2007)   Influenza vaccine due: 04/08/2008    Tetanus booster: 03/04/2002: Done.   Tetanus booster due: 03/04/2012    Pneumococcal vaccine: Not documented  Other Screening   Pap smear: ASCUS, high risk HPV neg  (09/08/2008)   Pap smear due: 09/08/2009   Smoking status: never  (09/23/2009)  Diabetes Mellitus   HgbA1C: 6.1  (05/25/2009)   Hemoglobin A1C due: 07/29/2008    Eye exam: Not documented    Foot exam: yes  (11/05/2007)   High risk foot: Not documented   Foot care education: completed  (11/05/2007)   Foot exam due: 11/04/2008    Urine microalbumin/creatinine ratio: Not documented   Urine microalbumin/cr due: 06/24/2008    Diabetes flowsheet reviewed?: Yes   Progress toward A1C goal: At goal  Lipids   Total Cholesterol: 313  (04/29/2008)   LDL: 212  (04/29/2008)   LDL Direct: 125  (09/08/2008)   HDL: 54  (04/29/2008)   Triglycerides: 236  (04/29/2008)    SGOT (AST): 26  (01/21/2009)   SGPT (ALT): 33  (01/21/2009) CMP ordered    Alkaline phosphatase: 68  (01/21/2009)   Total bilirubin: 0.6  (01/21/2009)    Lipid flowsheet reviewed?: Yes   Progress toward LDL goal: Unchanged  Self-Management Support :   Personal Goals (by the next clinic  visit) :     Personal A1C goal: 8  (09/23/2009)     Personal blood pressure goal: 130/80  (09/23/2009)     Personal LDL goal: 100  (09/23/2009)    Diabetes self-management support: Not documented    Diabetes self-management support not done because: Good outcomes  (09/23/2009)   Last diabetes self-management training by diabetes educator: completed    Lipid self-management support: Not  documented     Lipid self-management support not done because: Not indicated  (09/23/2009)    Self-management comments: just had child - will check at next visit her lipids.    Appended Document: A1c   7.0 %    Lab Visit  Laboratory Results   Blood Tests   Date/Time Received: September 23, 2009 11:02 AM  Date/Time Reported: September 23, 2009 11:55 AM   HGBA1C: 7.0%   (Normal Range: Non-Diabetic - 3-6%   Control Diabetic - 6-8%)  Comments: ...............test performed by......Marland KitchenBonnie A. [place], MLS (ASCP)cm    Orders Today:

## 2010-08-03 NOTE — Assessment & Plan Note (Signed)
Summary: F/U DM/EO   Vital Signs:  Patient profile:   35 year old female Height:      64.5 inches Weight:      195 pounds BMI:     33.07 BSA:     1.95 Temp:     98.1 degrees F Pulse rate:   71 / minute BP sitting:   110 / 75  Vitals Entered By: Jone Baseman CMA (Nov 04, 2009 9:27 AM) CC: f/u DM, hypothyroid Is Patient Diabetic? Yes Did you bring your meter with you today? No Pain Assessment Patient in pain? no        Primary Care Provider:  Ancil Boozer  MD  CC:  f/u DM and hypothyroid.  History of Present Illness: DM: doing well.  not checking sugars regularly any more.  taking metformin as prescribed.  described some GI upset upon eating french fries but since is resolving.  is back to work and plans to start watching her diet more closely.  denies any low sugars that she has noted.    she is currently nursing  thyroid: thought she was on but when got home realized she was actually on .  given tsh results at last check her thyroid was over corrected so her dose was dropped to .  she denies having palpitations or fatigue, denies polyuria or polydipsia, denies heat or cold intolerance.    Habits & Providers  Alcohol-Tobacco-Diet     Tobacco Status: never  Pap Smear  Procedure date:  11/11/2009  Findings:       Specimen Adequacy: Satisfactory for evaluation.   Interpretation/Result:Negative for intraepithelial Lesion or Malignancy.   Interpretation/Result:Fungal organisms c/w Candida present.  performed at [facility]      Current Medications (verified): 1)  Metformin Hcl 1000 Mg Tabs (Metformin Hcl) .... Take 1 Tablet By Mouth Two Times A Day For Diabetes.  Instructions in Spanish Please 2)  Synthroid 125 Mcg Tabs (Levothyroxine Sodium) .Marland Kitchen.. 1 By Mouth Once Daily For Thyroid.  Instructions in Spanish Please 3)  Vitaminas Prenatales .... Una Pastilla Al Dia  Allergies (verified): No Known Drug Allergies  Past History:  Past medical,  surgical, family and social histories (including risk factors) reviewed for relevance to current acute and chronic problems.  Past Medical History: vitiligo on face Type IIDM Hypothyroidism obesity PCOS astigmatism G2P2- SVD 11/07- 7lbs 8 oz, LTCS 08/11/2009 with macrosomia elevated LFTs 2/2 fatty liver disease  Past Surgical History:  U/S in  [place] cysts on ovaries - 01/02/2000 csection x1 08/11/2009  Family History: Reviewed history from 08/27/2007 and no changes required. F living with alcoholic cirrhosis M died 2, auto accident 28 Sister, aunt, uncle-DM Brother died of pneumonia 63 years of age, in [place]  Social History: Reviewed history from 09/23/2009 and no changes required. in a relationship with Gus Rankin (father of children) G2P2;  no tobacco no alcohol Daughter Helyn App (11/07), son Vickey Boak (02/11) works at [employer] enjoys walking/playing with kids speaks relatively good english but primary language is spanish  Review of Systems       per HPI  Physical Exam  General:  alert, no acute distress, well hydrated, well developed, and well nourished.   Lungs:  Normal respiratory effort, chest expands symmetrically. Lungs are clear to auscultation, no crackles or wheezes. Heart:  Normal rate and regular rhythm. S1 and S2 normal without gallop, murmur, click, rub or other extra sounds.   Impression & Recommendations:  Problem # 1:  DM,  UNCOMPLICATED, TYPE II (ICD-250.00) Assessment Unchanged  given that ACEI do not have good data in breastfeeding and patient has no hx of proteinuria and has been a well controlled diabetic will hold off on adding ACEI until after done nursing and then at that time only at very low dose given her blood pressures.  f/u late june/early july for next a1c also will need at that time FLP, CBC, Cmet, TSH  Her updated medication list for this problem includes:    Metformin Hcl 1000 Mg Tabs (Metformin  hcl) .Marland Kitchen... Take 1 tablet by mouth two times a day for diabetes.  instructions in spanish please  Orders: [facility]- Est Level  3 (40981)  Problem # 2:  HYPOTHYROIDISM NOS (ICD-244.9) Assessment: Unchanged  continue for now.  recheck tsh at next visit.   Her updated medication list for this problem includes:    Synthroid 125 Mcg Tabs (Levothyroxine sodium) .Marland Kitchen... 1 by mouth once daily for thyroid.  instructions in spanish please  Orders: [facility]- Est Level  3 (19147)  Complete Medication List: 1)  Metformin Hcl 1000 Mg Tabs (Metformin hcl) .... Take 1 tablet by mouth two times a day for diabetes.  instructions in spanish please 2)  Synthroid 125 Mcg Tabs (Levothyroxine sodium) .Marland Kitchen.. 1 by mouth once daily for thyroid.  instructions in spanish please 3)  Vitaminas Prenatales  .... Una pastilla al dia  Patient Instructions: 1)  Please follow up in very late June or early July for some blood work.  Make an early morning appt and don't have anything to eat or drink before then.  2)  Let me know when you quit nursing because at that point we should start another medicine to help protect your kidneys.  3)  It was nice to see you today.  Prevention & Chronic Care Immunizations   Influenza vaccine: Fluvax 3+  (04/09/2007)   Influenza vaccine due: 04/08/2008    Tetanus booster: 03/04/2002: Done.   Tetanus booster due: 03/04/2012    Pneumococcal vaccine: Not documented  Other Screening   Pap smear:  Specimen Adequacy: Satisfactory for evaluation.   Interpretation/Result:Negative for intraepithelial Lesion or Malignancy.   Interpretation/Result:Fungal organisms c/w Candida present.  performed at [facility]      (11/11/2009)   Pap smear due: 09/08/2009   Smoking status: never  (11/04/2009)  Diabetes Mellitus   HgbA1C: 7.0  (09/23/2009)   Hemoglobin A1C due: 07/29/2008    Eye exam: Not documented    Foot exam: yes  (11/05/2007)   High risk foot: Not documented   Foot care education:  completed  (11/05/2007)   Foot exam due: 11/04/2008    Urine microalbumin/creatinine ratio: Not documented   Urine microalbumin/cr due: 06/24/2008    Diabetes flowsheet reviewed?: Yes   Progress toward A1C goal: At goal  Lipids   Total Cholesterol: 313  (04/29/2008)   LDL: 212  (04/29/2008)   LDL Direct: 125  (09/08/2008)   HDL: 54  (04/29/2008)   Triglycerides: 236  (04/29/2008)    SGOT (AST): 66  (09/23/2009)   SGPT (ALT): 104  (09/23/2009)   Alkaline phosphatase: 122  (09/23/2009)   Total bilirubin: 0.3  (09/23/2009)    Lipid flowsheet reviewed?: Yes   Progress toward LDL goal: Unchanged  Self-Management Support :   Personal Goals (by the next clinic visit) :     Personal A1C goal: 8  (09/23/2009)     Personal blood pressure goal: 130/80  (09/23/2009)     Personal LDL goal:  100  (09/23/2009)    Diabetes self-management support: Not documented    Diabetes self-management support not done because: Good outcomes  (09/23/2009)   Last diabetes self-management training by diabetes educator: completed    Lipid self-management support: Not documented     Lipid self-management support not done because: Good outcomes  (11/04/2009)    Self-management comments: good outcomes = weight loss.  cannot take statin due to liver dysfxn

## 2010-08-03 NOTE — Assessment & Plan Note (Signed)
Summary: F/U VISIT/[facility]   Vital Signs:  Patient profile:   35 year old female Height:      64.5 inches Weight:      198.3 pounds BMI:     33.63 Temp:     98.3 degrees F oral Pulse rate:   78 / minute BP sitting:   118 / 73  (left arm) Cuff size:   regular  Vitals Entered By: Garen Grams LPN (December 31, 2009 9:19 AM) CC: f/u dm, HLD, hypothyroid Is Patient Diabetic? Yes Did you bring your meter with you today? No Pain Assessment Patient in pain? no        Primary Care Provider:  Ancil Boozer  MD  CC:  f/u dm, HLD, and hypothyroid.  History of Present Illness: DM: has been taking metformin as prescribed without side effects - no hypoglycemia, no GI upset.  hasn't been checking her sugars at home at this time.   still nursing so cannot yet take low dose ACEI  HLD: wants to get her LFTs rechecked and cholesterol rechecked to see if at a place she could take statin.  hypothyroid: denies palpitations, big change in weight, cold/heat intolerance.  taking synthroid 127mcg/daily. due for recheck of TSH  Habits & Providers  Alcohol-Tobacco-Diet     Tobacco Status: never  Current Medications (verified): 1)  Metformin Hcl 1000 Mg Tabs (Metformin Hcl) .... Take 1 Tablet By Mouth Two Times A Day For Diabetes.  Instructions in Spanish Please 2)  Synthroid 125 Mcg Tabs (Levothyroxine Sodium) .Marland Kitchen.. 1 By Mouth Once Daily For Thyroid.  Instructions in Spanish Please 3)  Vitaminas Prenatales .... Una Pastilla Al Dia 4)  Glipizide 10 Mg Xr24h-Tab (Glipizide) .Marland Kitchen.. 1 By Mouth Once Daily For Diabetes.  Instructions in Spanish Please 5)  Mirena 20 Mcg/24hr Iud (Levonorgestrel)  Allergies (verified): No Known Drug Allergies  Past History:  Past medical, surgical, family and social histories (including risk factors) reviewed for relevance to current acute and chronic problems.  Past Medical History: Reviewed history from 11/04/2009 and no changes required. vitiligo on face Type  IIDM Hypothyroidism obesity PCOS astigmatism G2P2- SVD 11/07- 7lbs 8 oz, LTCS 08/11/2009 with macrosomia elevated LFTs 2/2 fatty liver disease  Past Surgical History: Reviewed history from 11/04/2009 and no changes required.  U/S in  [place] cysts on ovaries - 01/02/2000 csection x1 08/11/2009  Family History: Reviewed history from 08/27/2007 and no changes required. F living with alcoholic cirrhosis M died 6, auto accident 50 Sister, aunt, uncle-DM Brother died of pneumonia 21 years of age, in [place]  Social History: Reviewed history from 11/04/2009 and no changes required. in a relationship with Gus Rankin (father of children) G2P2;  no tobacco no alcohol Daughter Helyn App (11/07), son Alleah Dearman (02/11) works at [employer] enjoys walking/playing with kids speaks relatively good english but primary language is spanish  Review of Systems       per HPI  Physical Exam  General:  alert, no acute distress, well hydrated, well developed, and well nourished.   VS noted Lungs:  Normal respiratory effort, chest expands symmetrically. Lungs are clear to auscultation, no crackles or wheezes. Heart:  Normal rate and regular rhythm. S1 and S2 normal without gallop, murmur, click, rub or other extra sounds. Extremities:  no edema. 2+ pulses   Impression & Recommendations:  Problem # 1:  DM, UNCOMPLICATED, TYPE II (ICD-250.00) Assessment Deteriorated  deteriorated.  will resume sulfonurea.  previously excellent control - i expect we will again establish  good control.   f/u 3 months due for foot check at next visit  Her updated medication list for this problem includes:    Metformin Hcl 1000 Mg Tabs (Metformin hcl) .Marland Kitchen... Take 1 tablet by mouth two times a day for diabetes.  instructions in spanish please    Glipizide 10 Mg Xr24h-tab (Glipizide) .Marland Kitchen... 1 by mouth once daily for diabetes.  instructions in spanish please  Orders: A1C-[facility] (19147) [facility]-  Est  Level 4 (99214)Future Orders: CBC-[facility] ([id number]) ... 12/10/2010  Labs Reviewed: Creat: 0.66 (09/23/2009)   Microalbumin: negative (06/25/2007) Reviewed HgBA1c results: 10.7 (12/31/2009)  7.0 (09/23/2009)  Problem # 2:  HYPOTHYROIDISM NOS (ICD-244.9) Assessment: Unchanged due for rechk of TSH level.  currently asymptomatic  Her updated medication list for this problem includes:    Synthroid 125 Mcg Tabs (Levothyroxine sodium) .Marland Kitchen... 1 by mouth once daily for thyroid.  instructions in spanish please  Orders: [facility]- Est  Level 4 ([address]214)Future Orders: TSH-[facility] ([id number]) ... 12/09/2010  Problem # 3:  HYPERLIPIDEMIA NEC/NOS (ICD-272.4) Assessment: Unchanged will set up time to check LFTS, repeat FLP.  currently stable  Orders: [facility]- Est  Level 4 ([address])Future Orders: Lipid-[facility] ([id number]) ... 12/17/2010 Comp Met-[facility] ([id number]) ... 12/17/2010  Complete Medication List: 1)  Metformin Hcl 1000 Mg Tabs (Metformin hcl) .... Take 1 tablet by mouth two times a day for diabetes.  instructions in spanish please 2)  Synthroid 125 Mcg Tabs (Levothyroxine sodium) .Marland Kitchen.. 1 by mouth once daily for thyroid.  instructions in spanish please 3)  Vitaminas Prenatales  .... Una pastilla al dia 4)  Glipizide 10 Mg Xr24h-tab (Glipizide) .Marland Kitchen.. 1 by mouth once daily for diabetes.  instructions in spanish please 5)  Mirena 20 Mcg/24hr Iud (Levonorgestrel)  Patient Instructions: 1)  Please schedule an appt for lab work one morning when you've had nothing to eat or drink in the next week or two. 2)  Don't be afraid to ask others to help you out so you can get some rest.  This is why you are having trouble forgetting things. 3)  Please follow up for next diabetes check in 3 months. Prescriptions: GLIPIZIDE 10 MG XR24H-TAB (GLIPIZIDE) 1 by mouth once daily for diabetes.  Instructions in spanish please  #30 x 3   Entered and Authorized by:   Ancil Boozer  MD   Signed by:   Ancil Boozer  MD on 12/31/2009    Method used:   Handwritten   RxID:   [id number]   Laboratory Results   Blood Tests   Date/Time Received: December 31, 2009 9:15 AM  Date/Time Reported: December 31, 2009 9:32 AM   HGBA1C: 10.7%   (Normal Range: Non-Diabetic - 3-6%   Control Diabetic - 6-8%)  Comments: ...............test performed by.................Marland KitchenGaren Grams, LPN .............entered by...........Marland KitchenBonnie A. [place]azil[place], MLS (ASCP)cm       Prevention & Chronic Care Immunizations   Influenza vaccine: Fluvax 3+  (04/09/2007)   Influenza vaccine due: 04/08/2008    Tetanus booster: 03/04/2002: Done.   Tetanus booster due: 03/04/2012    Pneumococcal vaccine: Not documented  Other Screening   Pap smear:  Specimen Adequacy: Satisfactory for evaluation.   Interpretation/Result:Negative for intraepithelial Lesion or Malignancy.   Interpretation/Result:Fungal organisms c/w Candida present.  performed at [facility]      (11/11/2009)   Pap smear due: 11/11/2012   Smoking status: never  (12/31/2009)  Diabetes Mellitus   HgbA1C: 10.7  (12/31/2009)   Hemoglobin A1C due: 07/29/2008    Eye exam: Not documented  Foot exam: yes  (11/05/2007)   High risk foot: Not documented   Foot care education: completed  (11/05/2007)   Foot exam due: 11/04/2008    Urine microalbumin/creatinine ratio: Not documented   Urine microalbumin/cr due: 06/24/2008    Diabetes flowsheet reviewed?: Yes   Progress toward A1C goal: Deteriorated  Lipids   Total Cholesterol: 313  (04/29/2008)   LDL: 212  (04/29/2008)   LDL Direct: 125  (09/08/2008)   HDL: 54  (04/29/2008)   Triglycerides: 236  (04/29/2008)    SGOT (AST): 66  (09/23/2009)   SGPT (ALT): 104  (09/23/2009) CMP ordered    Alkaline phosphatase: 122  (09/23/2009)   Total bilirubin: 0.3  (09/23/2009)    Lipid flowsheet reviewed?: Yes   Progress toward LDL goal: Unchanged  Self-Management Support :   Personal Goals (by the next clinic visit) :     Personal A1C goal: 8   (09/23/2009)     Personal blood pressure goal: 130/80  (09/23/2009)     Personal LDL goal: 100  (09/23/2009)    Diabetes self-management support: Not documented    Diabetes self-management support not done because: Not indicated  (12/31/2009)   Last diabetes self-management training by diabetes educator: completed    Lipid self-management support: Not documented     Lipid self-management support not done because: Not indicated  (12/31/2009)    Self-management comments: will reinitiate her glipizide. just had child, previously excellent control.   lipids not indicated - due for recheck (to schedule today) and cannot tolerate statin due to LFTs

## 2010-08-03 NOTE — Assessment & Plan Note (Signed)
Summary: f/up,tcb   Vital Signs:  Patient profile:   35 year old female Height:      64.5 inches Weight:      202.50 pounds BMI:     34.35 BSA:     1.98 Temp:     98.6 degrees F Pulse rate:   82 / minute BP sitting:   110 / 81  Vitals Entered By: Jone Baseman CMA (April 12, 2010 1:47 PM) CC: f/u meet new MD Is Patient Diabetic? No Pain Assessment Patient in pain? no        Primary Care Provider:  Alvia Grove DO  CC:  f/u meet new MD.  History of Present Illness: 35 yo female here for f/u of multiple medical problems: (1st visit with me) 1.DM: Continues taking metformin as prescribed without side effects.  No reports of hypoglycemic events, no GI upset.  Not checking CBG's at home.  No report of polyuria, polydipsia, blurry vision.  Just stopped breast feeding 2 days ago.   2. hypothyroid: Taking synthroid daily, in the morning, as instructed. Denies palpitations, no cold/heat intolerance, no big weight changes.  Synthroid dose was adjusted in July 2011.  Pt is tolerating increased dose. 3. HLD: Per pt report: Unable to start statin the past as she was breastfeeding and had abnormal liver tests.  Wants to know if she can take the medicine now that she has stopped breast feeding.    Habits & Providers  Alcohol-Tobacco-Diet     Alcohol drinks/day: 0     Tobacco Status: never     Cigarette Packs/Day: n/a  Current Medications (verified): 1)  Metformin Hcl 1000 Mg Tabs (Metformin Hcl) .... Take 1 Tablet By Mouth Two Times A Day For Diabetes.  Instructions in Spanish Please 2)  Synthroid 150 Mcg Tabs (Levothyroxine Sodium) .Marland Kitchen.. 1 By Mouth Once Daily For Thyroid.  Instructions in Spanish Please 3)  Vitaminas Prenatales .... Una Pastilla Al Dia 4)  Mirena 20 Mcg/24hr Iud (Levonorgestrel) 5)  Pravachol 10 Mg Tabs (Pravastatin Sodium) .... Take 1 Pill By Mouth Daily  Allergies (verified): No Known Drug Allergies  Past History:  Past Medical History: Last updated:  11/04/2009 vitiligo on face Type IIDM Hypothyroidism obesity PCOS astigmatism G2P2- SVD 11/07- 7lbs 8 oz, LTCS 08/11/2009 with macrosomia elevated LFTs 2/2 fatty liver disease  Past Surgical History: Last updated: 11/04/2009  U/S in  [place] cysts on ovaries - 01/02/2000 csection x1 08/11/2009  Family History: Last updated: 08/27/2007 F living with alcoholic cirrhosis M died 32, auto accident 84 Sister, aunt, uncle-DM Brother died of pneumonia 50 years of age, in [place]  Social History: Last updated: 11/04/2009 in a relationship with Gus Rankin (father of children) G2P2;  no tobacco no alcohol Daughter Helyn App (11/07), son Karson Chicas (02/11) works at [employer] enjoys walking/playing with kids speaks relatively good english but primary language is spanish  Risk Factors: Alcohol Use: 0 (04/12/2010) Exercise: yes (06/25/2007)  Risk Factors: Smoking Status: never (04/12/2010) Packs/Day: n/a (04/12/2010)  Social History: Reviewed history from 11/04/2009 and no changes required. in a relationship with Gus Rankin (father of children) G2P2;  no tobacco no alcohol Daughter Helyn App (11/07), son Roshan Salamon (02/11) works at [employer] enjoys walking/playing with kids speaks relatively good english but primary language is spanish  Review of Systems  The patient denies anorexia, fever, weight loss, weight gain, vision loss, decreased hearing, hoarseness, chest pain, syncope, dyspnea on exertion, peripheral edema, prolonged cough, headaches, hemoptysis, abdominal pain, melena, hematochezia,  severe indigestion/heartburn, hematuria, incontinence, genital sores, muscle weakness, suspicious skin lesions, transient blindness, difficulty walking, depression, unusual weight change, abnormal bleeding, enlarged lymph nodes, angioedema, breast masses, and testicular masses.    Physical Exam  General:  Vs reviewed, alert,  well-developed, well-nourished, and well-hydrated.   Neck:  No deformities, masses, or tenderness noted.no thyromegaly.   Lungs:  Normal respiratory effort, chest expands symmetrically. Lungs are clear to auscultation, no crackles or wheezes. Heart:  Normal rate and regular rhythm. S1 and S2 normal without gallop, murmur, click, rub or other extra sounds. Abdomen:  soft.   Neurologic:  cranial nerves II-XII intact and DTRs symmetrical and normal.   Psych:  Cognition and judgment appear intact. Alert and cooperative with normal attention span and concentration.   Impression & Recommendations:  Problem # 1:  DM, UNCOMPLICATED, TYPE II (ICD-250.00) HgBA1C improved to 8.5 today.  Tolerating metformin well, will continue today and continue to monitor. Her updated medication list for this problem includes:    Metformin Hcl 1000 Mg Tabs (Metformin hcl) .Marland Kitchen... Take 1 tablet by mouth two times a day for diabetes.  instructions in spanish please  Orders: A1C-[facility] (37628) [facility]- Est  Level 4 (31517)  Problem # 2:  HYPERLIPIDEMIA NEC/NOS (ICD-272.4) Reviewed prior labs. LDL 174 Recheck CMP today and start low dose statin. Monitor tolerabce see pt instructions Her updated medication list for this problem includes:    Pravachol 10 Mg Tabs (Pravastatin sodium) .Marland Kitchen... Take 1 pill by mouth daily  Orders: [facility]- Est  Level 4 (61607)  Problem # 3:  HYPOTHYROIDISM NOS (ICD-244.9) Recheck TSH roday.   Adjust meds if neccesary. See pt instructions Her updated medication list for this problem includes:    Synthroid 150 Mcg Tabs (Levothyroxine sodium) .Marland Kitchen... 1 by mouth once daily for thyroid.  instructions in spanish please  Orders: TSH-[facility] ([id number]) [facility]- Est  Level 4 (54627)  Complete Medication List: 1)  Metformin Hcl 1000 Mg Tabs (Metformin hcl) .... Take 1 tablet by mouth two times a day for diabetes.  instructions in spanish please 2)  Synthroid 150 Mcg Tabs (Levothyroxine sodium) .Marland Kitchen.. 1 by  mouth once daily for thyroid.  instructions in spanish please 3)  Vitaminas Prenatales  .... Una pastilla al dia 4)  Mirena 20 Mcg/24hr Iud (Levonorgestrel) 5)  Pravachol 10 Mg Tabs (Pravastatin sodium) .... Take 1 pill by mouth daily  Other Orders: Comp Met-[facility] [id number]) Hep C Ab-[facility] ([id number])  Patient Instructions: 1)  Nice to meet you today! 2)  Please continue taking your Synthroid everyday. 3)  I am going to check some labs today and I will call you if anything is abnormal. 4)  As we discussed we are going to start a medicine for your cholesterol, if you have any concerns while taking this, please call me. I have sent the medicine to [facility] for you to pick up. 5)  Return for another lab visit in 6 weeks Prescriptions: PRAVACHOL 10 MG TABS (PRAVASTATIN SODIUM) take 1 pill by mouth daily  #30 x 2   Entered and Authorized by:   Alvia Grove DO   Signed by:   Alvia Grove DO on 04/12/2010   Method used:   Electronically to        [facility] [address] (retail)       [phone]       [place], [place]  [address]       Ph: [phone]  Fax: [phone]   RxID:   [id number]   Laboratory Results   Blood Tests   Date/Time Received: April 12, 2010 1:41 PM  Date/Time Reported: April 12, 2010 1:52 PM   HGBA1C: 8.5%   (Normal Range: Non-Diabetic - 3-6%   Control Diabetic - 6-8%)  Comments: ...........test performed by...........Marland KitchenTerese Door, CMA

## 2010-08-03 NOTE — Letter (Signed)
Summary: Generic Letter  [facility]  [address]   [place], [place] [address]   Phone: [phone]  Fax: [phone]    04/15/2010  [name] [address] [place], [place]  [address]  Dear Ms. [name],   I have reviewed your recent labs from your last visit.  Your glucose was slightly elevated to 174.  Other than that, everything looked great!  Conitnue taking your Synthroid for your thyroid and your other medications as we discussed.  If you have questions, please call my office.        Sincerely,   Alvia Grove DO  Appended Document: Generic Letter mailed.

## 2010-08-03 NOTE — Letter (Signed)
Summary: Generic Letter  [facility]  [address]   [place], [place] [address]   Phone: [phone]  Fax: [phone]    05/30/2010  [name] [address] [place], [place]  [address]  Dear [name],     I have reviewed your recent labs.  Please schedule an appointment with me to discuss them.  I look forward to seeing you!      Sincerely,   Alvia Grove DO  Appended Document: Generic Letter mailed

## 2010-08-05 NOTE — Assessment & Plan Note (Signed)
Summary: f/u dm/[facility]   Vital Signs:  Patient profile:   35 year old female Height:      64.5 inches Weight:      200.4 pounds BMI:     33.99 Temp:     98.5 degrees F oral Pulse rate:   73 / minute BP sitting:   109 / 72  (left arm) Cuff size:   regular  Vitals Entered By: Garen Grams LPN (July 13, 2010 9:25 AM) CC: f/u dm Is Patient Diabetic? Yes Did you bring your meter with you today? No Pain Assessment Patient in pain? no        Primary Provider:  Alvia Grove DO  CC:  f/u dm.  History of Present Illness: 1. f/u DM-pt doing well on metformin and glipizide.  she denies any hypoglycemic episodes.  she does not regularly check her CBGs, however, her fastings are typically 98-110.  2. cough-she states she has been having a cough for the past month that is worse at night, associated with postnasal drip, rhinorrhea, and sneezing.  she denies any history of allergies however, her daughter has allergies. she has not tried any medication.  Preventive Screening-Counseling & Management  Alcohol-Tobacco     Alcohol drinks/day: 0     Smoking Status: never     Packs/Day: n/a  Allergies: No Known Drug Allergies  Past History:  Past medical, surgical, family and social histories (including risk factors) reviewed, and no changes noted (except as noted below).  Past Medical History: Reviewed history from 11/04/2009 and no changes required. vitiligo on face Type IIDM Hypothyroidism obesity PCOS astigmatism G2P2- SVD 11/07- 7lbs 8 oz, LTCS 08/11/2009 with macrosomia elevated LFTs 2/2 fatty liver disease  Past Surgical History: Reviewed history from 11/04/2009 and no changes required.  U/S in  [place] cysts on ovaries - 01/02/2000 csection x1 08/11/2009  Family History: Reviewed history from 08/27/2007 and no changes required. F living with alcoholic cirrhosis M died 66, auto accident 54 Sister, aunt, uncle-DM Brother died of pneumonia 39 years of age, in  [place]  Social History: Reviewed history from 11/04/2009 and no changes required. in a relationship with Gus Rankin (father of children) G2P2;  no tobacco no alcohol Daughter Helyn App (11/07), son Livianna Petraglia (02/11) works at [employer] enjoys walking/playing with kids speaks relatively good english but primary language is spanish  Physical Exam  General:  Well-developed,well-nourished,in no acute distress; alert,appropriate and cooperative throughout examination Head:  Normocephalic and atraumatic without obvious abnormalities. +vitiligo on face Ears:  External ear exam shows no significant lesions or deformities.  Otoscopic examination reveals clear canals, tympanic membranes are intact bilaterally without bulging, retraction, inflammation or discharge. Hearing is grossly normal bilaterally. Nose:  no nasal polyps, no mucosal friability, no active bleeding or clots, no sinus percussion tenderness, no septum abnormalities, and mucosal erythema.  no maxillary or frontal sinus tenderness to palpation. Mouth:  Oral mucosa and oropharynx without lesions or exudates.  Teeth in good repair. Lungs:  Normal respiratory effort, chest expands symmetrically. Lungs are clear to auscultation, no crackles or wheezes. Heart:  Normal rate and regular rhythm. S1 and S2 normal without gallop, murmur, click, rub or other extra sounds. Extremities:  No clubbing, cyanosis, edema, or deformity noted with normal full range of motion of all joints.     Impression & Recommendations:  Problem # 1:  ALLERGIC RHINITIS, SEASONAL (ICD-477.0) Assessment New  start claritin for symptoms.  continue to monitor.   Orders: [facility]- Est Level  3 (  [address])  Problem # 2:  DM, UNCOMPLICATED, TYPE II (ICD-250.00) Assessment: Unchanged  Her updated medication list for this problem includes:    Metformin Hcl 1000 Mg Tabs (Metformin hcl) .Marland Kitchen... Take 1 tablet by mouth two times a day for diabetes.   instructions in spanish please    Glipizide Xl 10 Mg Xr24h-tab (Glipizide) ..... One tab by mouth daily for dm  Orders: A1C-[facility] (60454) [facility]- Est Level  3 ([address]11)  Problem # 3:  HYPERLIPIDEMIA NEC/NOS (ICD-272.4) Assessment: Unchanged  Orders: Comp Met-[facility] ([id number]) [facility]- Est Level  3 ([address])  pt with mildly elevated LFTs in 05/2010.  will recheck today.  cont statin.   Problem # 4:  HYPOTHYROIDISM NOS (ICD-244.9) Assessment: Unchanged  Her updated medication list for this problem includes:    Synthroid 150 Mcg Tabs (Levothyroxine sodium) .Marland Kitchen... 1 by mouth once daily for thyroid.  instructions in spanish please  Orders: TSH-[facility] ([id number]) [facility]- Est Level  3 ([address]213)  recheck TSH.  prev elevated and dose adjusted at that time.  assess for stability. cont current dose and medication.  Complete Medication List: 1)  Metformin Hcl 1000 Mg Tabs (Metformin hcl) .... Take 1 tablet by mouth two times a day for diabetes.  instructions in spanish please 2)  Synthroid 150 Mcg Tabs (Levothyroxine sodium) .Marland Kitchen.. 1 by mouth once daily for thyroid.  instructions in spanish please 3)  Mirena 20 Mcg/24hr Iud (Levonorgestrel) 4)  Loratadine 10 Mg Tabs (Loratadine) .... One tab by mouth daily for allergies 5)  Glipizide Xl 10 Mg Xr24h-tab (Glipizide) .... One tab by mouth daily for dm  Patient Instructions: 1)  it was a pleasure to meet you today.  2)  please make a follow up appointment in 3 months.  3)  we are checking labs today and if they are abnormal we will call you and you may need another appointment prior to 3 months.  4)  continue your medication as prescribed. 5)  there is a new allergy medication to try as well to help with your symptoms.  Prescriptions: LORATADINE 10 MG TABS (LORATADINE) one tab by mouth daily for allergies  #30 x 1   Entered and Authorized by:   Maryelizabeth Kaufmann MD   Signed by:   Maryelizabeth Kaufmann MD on 07/13/2010   Method used:   Electronically to         [facility] [address] (retail)       [phone]       [place], [place]  [address]       Ph: [phone]       Fax: [phone]   RxID:   [id number]    Orders Added: 1)  A1C-[facility] [83036] 2)  Comp Met-[facility] [[id number]] 3)  TSH-[facility] [[id number]] 4)  [facility]- Est Level  3 [[address]25]    Laboratory Results   Blood Tests   Date/Time Received: July 13, 2010 9:19 AM  Date/Time Reported: July 13, 2010 9:33 AM   HGBA1C: 9.7%   (Normal Range: Non-Diabetic - 3-6%   Control Diabetic - 6-8%)  Comments: ...............test performed by.................Marland KitchenGaren Grams, LPN .............entered by...........Marland KitchenBonnie A. [place], MLS (ASCP)cm

## 2010-08-16 ENCOUNTER — Encounter: Payer: Self-pay | Admitting: *Deleted

## 2010-09-19 LAB — POCT URINALYSIS DIP (DEVICE)
Bilirubin Urine: NEGATIVE
Glucose, UA: 100 mg/dL — AB
Glucose, UA: NEGATIVE mg/dL
Glucose, UA: NEGATIVE mg/dL
Hgb urine dipstick: NEGATIVE
Hgb urine dipstick: NEGATIVE
Hgb urine dipstick: NEGATIVE
Ketones, ur: NEGATIVE mg/dL
Nitrite: NEGATIVE
Nitrite: NEGATIVE
Nitrite: NEGATIVE
Nitrite: NEGATIVE
Nitrite: NEGATIVE
Protein, ur: NEGATIVE mg/dL
Protein, ur: NEGATIVE mg/dL
Protein, ur: NEGATIVE mg/dL
Protein, ur: NEGATIVE mg/dL
Protein, ur: NEGATIVE mg/dL
Specific Gravity, Urine: 1.02 (ref 1.005–1.030)
Specific Gravity, Urine: 1.02 (ref 1.005–1.030)
Specific Gravity, Urine: 1.015 (ref 1.005–1.030)
Urobilinogen, UA: 0.2 mg/dL (ref 0.0–1.0)
Urobilinogen, UA: 2 mg/dL — ABNORMAL HIGH (ref 0.0–1.0)
Urobilinogen, UA: 0.2 mg/dL (ref 0.0–1.0)
Urobilinogen, UA: 0.2 mg/dL (ref 0.0–1.0)
Urobilinogen, UA: 1 mg/dL (ref 0.0–1.0)
pH: 6 (ref 5.0–8.0)
pH: 5.5 (ref 5.0–8.0)
pH: 5.5 (ref 5.0–8.0)

## 2010-09-22 LAB — CBC
MCV: 83.7 fL (ref 78.0–100.0)
Platelets: 121 10*3/uL — ABNORMAL LOW (ref 150–400)
Platelets: 148 10*3/uL — ABNORMAL LOW (ref 150–400)
Platelets: 150 10*3/uL (ref 150–400)
RBC: 3.67 MIL/uL — ABNORMAL LOW (ref 3.87–5.11)
RBC: 3.89 MIL/uL (ref 3.87–5.11)
RDW: 14.6 % (ref 11.5–15.5)
WBC: 6.7 10*3/uL (ref 4.0–10.5)
WBC: 7.4 10*3/uL (ref 4.0–10.5)

## 2010-09-22 LAB — RPR
RPR Ser Ql: NONREACTIVE
RPR Ser Ql: NONREACTIVE

## 2010-09-22 LAB — BASIC METABOLIC PANEL
BUN: 7 mg/dL (ref 6–23)
Creatinine, Ser: 0.63 mg/dL (ref 0.4–1.2)
GFR calc Af Amer: >60 mL/min (ref >60)
GFR calc non Af Amer: >60 mL/min (ref >60)
Potassium: 4 mEq/L (ref 3.5–5.1)

## 2010-09-22 LAB — GLUCOSE, CAPILLARY
Glucose-Capillary: 120 mg/dL — ABNORMAL HIGH (ref 70–99)
Glucose-Capillary: 73 mg/dL (ref 70–99)
Glucose-Capillary: 78 mg/dL (ref 70–99)

## 2010-09-22 LAB — POCT URINALYSIS DIP (DEVICE)
Bilirubin Urine: NEGATIVE
Nitrite: NEGATIVE
Protein, ur: NEGATIVE mg/dL
pH: 5.5 (ref 5.0–8.0)

## 2010-09-22 LAB — CCBB MATERNAL DONOR DRAW

## 2010-09-22 LAB — GLUCOSE, RANDOM: Glucose, Bld: 111 mg/dL — ABNORMAL HIGH (ref 70–99)

## 2010-10-04 LAB — POCT URINALYSIS DIP (DEVICE)
Bilirubin Urine: NEGATIVE
Hgb urine dipstick: NEGATIVE
Ketones, ur: NEGATIVE mg/dL
Nitrite: NEGATIVE
Protein, ur: NEGATIVE mg/dL
Protein, ur: NEGATIVE mg/dL
Urobilinogen, UA: 1 mg/dL (ref 0.0–1.0)
Urobilinogen, UA: 1 mg/dL (ref 0.0–1.0)
pH: 5.5 (ref 5.0–8.0)
pH: 6 (ref 5.0–8.0)

## 2010-10-05 LAB — POCT URINALYSIS DIP (DEVICE)
Protein, ur: NEGATIVE mg/dL
Specific Gravity, Urine: 1.025 (ref 1.005–1.030)
Urobilinogen, UA: 1 mg/dL (ref 0.0–1.0)

## 2010-10-06 LAB — POCT URINALYSIS DIP (DEVICE)
Bilirubin Urine: NEGATIVE
Bilirubin Urine: NEGATIVE
Glucose, UA: NEGATIVE mg/dL
Glucose, UA: >=1000 mg/dL — AB
Glucose, UA: NEGATIVE mg/dL
Hgb urine dipstick: NEGATIVE
Hgb urine dipstick: NEGATIVE
Ketones, ur: NEGATIVE mg/dL
Nitrite: NEGATIVE
Nitrite: NEGATIVE
Nitrite: NEGATIVE
Nitrite: NEGATIVE
Nitrite: NEGATIVE
Protein, ur: NEGATIVE mg/dL
Protein, ur: NEGATIVE mg/dL
Protein, ur: NEGATIVE mg/dL
Specific Gravity, Urine: 1.01 (ref 1.005–1.030)
Urobilinogen, UA: 0.2 mg/dL (ref 0.0–1.0)
Urobilinogen, UA: 0.2 mg/dL (ref 0.0–1.0)
Urobilinogen, UA: 0.2 mg/dL (ref 0.0–1.0)
Urobilinogen, UA: 0.2 mg/dL (ref 0.0–1.0)
pH: 5.5 (ref 5.0–8.0)
pH: 5.5 (ref 5.0–8.0)
pH: 5.5 (ref 5.0–8.0)

## 2010-10-06 LAB — GLUCOSE, CAPILLARY
Glucose-Capillary: 129 mg/dL — ABNORMAL HIGH (ref 70–99)
Glucose-Capillary: 96 mg/dL (ref 70–99)

## 2010-10-07 LAB — POCT URINALYSIS DIP (DEVICE)
Nitrite: NEGATIVE
Protein, ur: NEGATIVE mg/dL
Urobilinogen, UA: 1 mg/dL (ref 0.0–1.0)
pH: 6 (ref 5.0–8.0)

## 2010-10-08 LAB — POCT URINALYSIS DIP (DEVICE)
Glucose, UA: NEGATIVE mg/dL
Hgb urine dipstick: NEGATIVE
Nitrite: NEGATIVE
Nitrite: NEGATIVE
Protein, ur: NEGATIVE mg/dL
Specific Gravity, Urine: 1.025 (ref 1.005–1.030)
Urobilinogen, UA: 1 mg/dL (ref 0.0–1.0)
Urobilinogen, UA: 0.2 mg/dL (ref 0.0–1.0)

## 2010-10-09 LAB — POCT URINALYSIS DIP (DEVICE)
Bilirubin Urine: NEGATIVE
Bilirubin Urine: NEGATIVE
Glucose, UA: NEGATIVE mg/dL
Glucose, UA: 250 mg/dL — AB
Ketones, ur: NEGATIVE mg/dL
Nitrite: NEGATIVE
Nitrite: NEGATIVE
Nitrite: NEGATIVE
Protein, ur: NEGATIVE mg/dL
Specific Gravity, Urine: 1.02 (ref 1.005–1.030)
Urobilinogen, UA: 0.2 mg/dL (ref 0.0–1.0)
Urobilinogen, UA: 1 mg/dL (ref 0.0–1.0)
pH: 5.5 (ref 5.0–8.0)

## 2010-11-19 NOTE — Op Note (Signed)
[name], [name]       ACCOUNT NO.:  [id number]   MEDICAL RECORD NO.:  [id number]          PATIENT TYPE:  AMB   LOCATION:  SDC                           FACILITY:  [facility]   PHYSICIAN:  Juan H. Lily Peer, M.D.DATE OF BIRTH:  01/16/1976   DATE OF PROCEDURE:  06/08/2006  DATE OF DISCHARGE:                               OPERATIVE REPORT   PREOPERATIVE DIAGNOSIS:  1. Postpartum hemorrhage.  2. Rule out retained products of conception.   POSTOPERATIVE DIAGNOSIS:  1. Postpartum hemorrhage.  2. Retained products of conception.   OPERATION PERFORMED:  Emergency dilation and evacuation.   SURGEON:  Juan H. Lily Peer, M.D.   FINDINGS:  Uterus firm but approximately 16 weeks size.  Cervix dilated  with tissue and clots present at the os and in the vagina.   ANESTHESIA:   INDICATIONS FOR PROCEDURE:  The patient is a 35 year old gravida 1, para  1, status post vaginal delivery approximately eight days ago, has  started complaining of heavier bleeding last night and this morning but  prior to that she had been changing pads five times per day.  She had a  significant amount of blood and clots in her vagina and ultrasound had  demonstrated questionable retained products of conception.  Her  hemoglobin was 10.6 and white blood cell count 7400 and blood type was 0  positive.  She was taken to the operating room for dilation and  evacuation for the retained products of conception.   DESCRIPTION OF PROCEDURE:  After the patient was taken to the operating  room, she underwent MAC anesthesia/intravenous sedation.  Did not  require any cervical/paracervical anesthetic.  She was comfortable.  The  intrauterine cavity was curetted with a banjo curette followed by  insertion of a 12 mm suction curette to remove the remaining products of  conception.  She was given methergine 0.2 mg IM and Pitocin drip was  started with 20 units per liter.  Prior to the procedure, her bladder  had been  evacuated for approximately 500 mL of urine.  The patient  tolerated the procedure well.  Blood loss approximately 200 mL.  IV  fluids were 1000 mL lactated Ringers.  She received a gram of Ancef IV.      Juan H. Lily Peer, M.D.  Electronically Signed    JHF/MEDQ  D:  06/08/2006  T:  06/08/2006  Job:  [id number]

## 2010-11-19 NOTE — H&P (Signed)
[name], [name]       ACCOUNT NO.:  [id number]   MEDICAL RECORD NO.:  [id number]          PATIENT TYPE:  MAT   LOCATION:  MATC                          [facility]ILITY:  [facility]   PHYSICIAN:  Juan H. Lily Peer, M.D.DATE OF BIRTH:  1976/02/23   DATE OF ADMISSION:  05/31/2006  DATE OF DISCHARGE:                                HISTORY & PHYSICAL   CHIEF COMPLAINTS:  1. Term intrauterine pregnancy at 39-1/2 weeks estimated gestational age.  2. Type 2 diabetes.  3. Hypothyroidism.   HISTORY:  The patient is a 35 year old gravida 1 para 0 with known history  of type 2 diabetes, prior to conception.  She had been on Metformin and also  had been on Statins for hypercholesterolemia before her pregnancy.  She was  followed closely with her blood sugars and have been followed also by Dr.  Talmage Nap her endocrinologist and as of most recently the patient had been on a  split regimen of insulin which was as follows, 12 units of NPH q.h.s.  subcutaneous; 7 units of regular insulin along with 4 units of NPH at  breakfast; 3 units of NPH at lunchtime; and 4 units of NPH at dinnertime.  The patient's most recent blood sugars in the office today were normal range  and she had a reactive nonstress test.  Her most recent ultrasound was on  May 24, 2006, whereby an estimated fetal weight of 3,627 grams was  estimated with a normal AFI and the fetus being in the vertex presentation.  The patient had declined genetic amniocentesis after it was offered to her  since there were family members with some form of mental retardation and she  had declined that as well as first trimester screening.  She did have a  normal maternal serum alpha fetoprotein.  She does have a history of  hypothyroidism as well for which she is on Synthroid 112 mcg every day.  Her  last TSH was November 1st, which was in the normal range.   PAST MEDICAL HISTORY:  1. Patient with __________.  2. Type 2 diabetes.  3.  Hypercholesterolemia.  4. Hypothyroidism.   THE PATIENT DENIES ANY ALLERGIES.   REVIEW OF SYSTEMS:  See [place] form.   PHYSICAL EXAMINATION:  VITAL SIGNS:  The patient weighs 213 pounds.  Urine  was negative for protein and glucose.  Blood pressure 118/72.  HEENT:  Unremarkable with the exception of __________  .  LUNGS:  Clear to auscultation without rhonchi or wheezes.  HEART:  Regular rate and rhythm.  No murmurs or gallops.  BREAST:  Not done.  ABDOMEN:  Gravid uterus.  Vertex presentation by [facility] maneuver, confirmed  by ultrasound recently.  Fundal height 39-cm.  PELVIC:  Cervix 1-cm, 50% ballottable.  EXTREMITIES:  DTRs are 1+.  Negative clonus.   PRENATAL LABORATORY:  O positive blood type.  Negative antibody screen.  VDRL was nonreactive.  Rubella negative.  Hepatitis B surface antigen and  HIV were negative.  Alpha fetoprotein was normal.  The patient declined  first trimester screening as well as genetic amniocentesis.  GBS culture was  negative.   ASSESSMENT:  A  35 year old gravida 1, para 0 at 19.5 weeks estimated  gestational age, type 2 diabetic on split regimen insulin.   The patient will be admitted on the evening of May 31, 2006 for  cervical ripening with Cervidil, followed by Pitocin augmentation the  following morning.  The patient was instructed to hold off on her dinner as  well as bedtime insulin and we will follow her with her Glucomander  protocol.  She is GBS negative.  The risks, benefits, and pros and cons of  induction were discussed.   PLAN:  As per assessment above.      Juan H. Lily Peer, M.D.  Electronically Signed     JHF/MEDQ  D:  05/30/2006  T:  05/30/2006  Job:  [id number]

## 2010-11-24 ENCOUNTER — Ambulatory Visit (INDEPENDENT_AMBULATORY_CARE_PROVIDER_SITE_OTHER): Payer: Self-pay | Admitting: Family Medicine

## 2010-11-24 ENCOUNTER — Encounter: Payer: Self-pay | Admitting: Family Medicine

## 2010-11-24 VITALS — BP 111/78 | HR 86 | Temp 98.3°F | Wt 195.0 lb

## 2010-11-24 DIAGNOSIS — N390 Urinary tract infection, site not specified: Secondary | ICD-10-CM

## 2010-11-24 DIAGNOSIS — R300 Dysuria: Secondary | ICD-10-CM

## 2010-11-24 LAB — POCT UA - MICROSCOPIC ONLY

## 2010-11-24 LAB — POCT URINALYSIS DIPSTICK
Bilirubin, UA: NEGATIVE
Ketones, UA: NEGATIVE
Protein, UA: >=300
Spec Grav, UA: 1.02

## 2010-11-24 MED ORDER — CIPROFLOXACIN HCL 500 MG PO TABS: 500.0000 mg | ORAL_TABLET | Freq: Two times a day (BID) | ORAL | Status: DC

## 2010-11-24 MED ORDER — METFORMIN HCL 1000 MG PO TABS: 1000.0000 mg | ORAL_TABLET | Freq: Two times a day (BID) | ORAL | Status: DC

## 2010-11-24 MED ORDER — CIPROFLOXACIN HCL 500 MG PO TABS: 500.0000 mg | ORAL_TABLET | Freq: Two times a day (BID) | ORAL | Status: AC

## 2010-11-24 MED ORDER — GLIPIZIDE ER 10 MG PO TB24: 10.0000 mg | ORAL_TABLET | Freq: Every day | ORAL | Status: DC

## 2010-11-24 MED ORDER — LEVOTHYROXINE SODIUM 150 MCG PO TABS: 150.0000 ug | ORAL_TABLET | Freq: Every day | ORAL | Status: DC

## 2010-11-24 NOTE — Progress Notes (Signed)
  Subjective:    Patient ID: [name], female    DOB: Jun 23, 1976, 35 y.o.   MRN: [id number]  HPI  1) Dysuria: x 2 days. Never had UTI before. Increased urinary frequency. Hematuria. Reports some right sided low back pain starting today. Denies fever, chills, nausea, emesis, diarrhea, lethargy, decreased appetite, abdominal pain.   Past history reviewed  Review of Systems As per HPI     Objective:   Physical Exam General: well appearing, obese, NAD  Abdominal: + CVA tenderness on right, abdomen non-tender, non distended, normoactive bowel sounds, no rebound or guarding, no suprapubic tenderness       Assessment & Plan:

## 2010-11-24 NOTE — Patient Instructions (Signed)
Take the antibiotic as directed.  Pick up the rest of your medicines. Make an appointment to meet with Dr. Orvan Falconer in 2-4 weeks to follow up on your diabetes.   - Dr. Wallene Huh      Urinary Tract Infection (UTI) Infections of the urinary tract can start in several places. A bladder infection (cystitis), a kidney infection (pyelonephritis), and a prostate infection (prostatitis) are different types of urinary tract infections. They usually get better if treated with medicines (antibiotics) that kill germs. Take all the medicine until it is gone. You or your child may feel better in a few days, but TAKE ALL MEDICINE or the infection may not respond and may become more difficult to treat. HOME CARE INSTRUCTIONS  Drink enough water and fluids to keep the urine clear or pale yellow. Cranberry juice is especially recommended, in addition to large amounts of water.   Avoid caffeine, tea, and carbonated beverages. They tend to irritate the bladder.   Alcohol may irritate the prostate.   Only take over-the-counter or prescription medicines for pain, discomfort, or fever as directed by your caregiver.  FINDING OUT THE RESULTS OF YOUR TEST Not all test results are available during your visit. If your or your child's test results are not back during the visit, make an appointment with your caregiver to find out the results. Do not assume everything is normal if you have not heard from your caregiver or the medical facility. It is important for you to follow up on all test results. TO PREVENT FURTHER INFECTIONS:  Empty the bladder often. Avoid holding urine for long periods of time.   After a bowel movement, women should cleanse from front to back. Use each tissue only once.   Empty the bladder before and after sexual intercourse.  SEEK MEDICAL CARE IF:  There is back pain.   You or your child has an oral temperature above 102.5   Your baby is older than 3 months with a rectal temperature of  100.5 F (38.1 C) or higher for more than 1 day.   Your or your child's problems (symptoms) are no better in 3 days. Return sooner if you or your child is getting worse.  SEEK IMMEDIATE MEDICAL CARE IF:  There is severe back pain or lower abdominal pain.   You or your child develops chills.   You or your child has an oral temperature above 102.5, not controlled by medicine.   Your baby is older than 3 months with a rectal temperature of 102 F (38.9 C) or higher.   Your baby is 37 months old or younger with a rectal temperature of 100.4 F (38 C) or higher.   There is nausea or vomiting.   There is continued burning or discomfort with urination.  MAKE SURE YOU:  Understand these instructions.   Will watch this condition.   Will get help right away if you or your child is not doing well or gets worse.  Document Released: 03/30/2005 Document Re-Released: 09/14/2009 [facility] Patient Information 2011 [place], [place].Infeccin del tracto urinario (ITU) (Urinary Tract Infection, UTI) Las infecciones en el tracto urinario pueden comenzar en varios lugares. Una infeccin en la vejiga (cistitis), una infeccin en el rin (pielonefritis) o una infeccin en la prstata (prostatitis) son diferentes tipos de infeccin del tracto urinario. Por lo general mejoran si se los trata con antibiticos. Los antibiticos son medicamentos que matan grmenes. [employer] medicamentos que le han recetado hasta que se terminen. Podr sentirse [employer]  dentro de [address], pero DEBE TOMAR LOS MEDICAMENTOS HASTA TERMINAR EL TRATAMIENTO, de lo contrario la infeccin puede no solucionarse y luego ser ms difcil de Warehouse manager. INSTRUCCIONES PARA EL CUIDADO DOMICILIARIO  Beba gran cantidad de lquidos para mantener la orina de tono claro o color amarillo plido. Se recomienda especialmente el jugo de arndanos rojos, adems de grandes cantidades de [place].   Evite la cafena, el t y las bebidas con gas. Estas  sustancias irritan la vejiga.   El alcohol puede Teacher, adult education.   Utilice los medicamentos de venta libre o de prescripcin para Chief Technology Officer, Environmental health practitioner o la [place], segn se lo indique el profesional que lo asiste.  OBTENER LOS RESULTADOS DE LAS PRUEBAS Durante su visita no contar con todos los [employer]. En este caso, tenga otra entrevista con su mdico para conocerlos. No piense que el resultado es normal si no tiene noticias de su mdico o de la institucin mdica. Es Copy seguimiento de todos los [facility].  PARA PREVENIR FUTURAS INFECCIONES:  Vace la vejiga con frecuencia. Evite retener la orina durante largos perodos.   Despus de mover el intestino, las mujeres deben higienizarse la regin perineal desde adelante hacia atrs. Use cada papel tissue slo una vez.   Vace la vejiga antes y despus de Management consultant.  SOLICITE ATENCIN MDICA SI:  Siente dolor en la espalda.   El beb tiene ms de 3 meses y su temperatura rectal es de 100.5 F (38.1 C) o ms durante ms de 1 da.   Los problemas (sntomas) no mejoran en 3 das. Solicite atencin mdica antes si empeora.  SOLICITE ATENCIN MDICA DE INMEDIATO SI:  Comienza a sentir un dolor de espaldas o en la zona abdominal inferior intenso.   Comienza a sentir escalofros.   Usted o su nio tienen una temperatura oral de ms de 102.5 y no puede controlarla con medicamentos.   Su beb tiene ms de 3 meses y su temperatura rectal es de 102 F (38,9 C) o mayor.   Su beb tiene 3 meses o menos y su temperatura rectal es de 100,4 F (38 C) o mayor.   Siente nuseas o vmitos.   Tiene una sensacin continua de quemazn o molestias al [employer].  EST SEGURO QUE:  Comprende las instrucciones para el alta mdica.   Controlar su enfermedad.   Solicitar atencin mdica de inmediato segn las indicaciones.  Document Released: 03/30/2005 Document Re-Released:  12/08/2009 [facility] Patient Information 2011 [place], [place].

## 2010-11-24 NOTE — Assessment & Plan Note (Signed)
Will treat with ciprofloxacin as below given flank pain. Follow up with PCP in 2 weeks for chronic medical issues - refilled medications once month supply only. Red flags reviewed. Handout given.

## 2010-12-14 ENCOUNTER — Ambulatory Visit (INDEPENDENT_AMBULATORY_CARE_PROVIDER_SITE_OTHER): Payer: Self-pay | Admitting: Family Medicine

## 2010-12-14 ENCOUNTER — Encounter: Payer: Self-pay | Admitting: Family Medicine

## 2010-12-14 VITALS — BP 111/72 | HR 71 | Temp 99.1°F | Ht 64.5 in | Wt 200.0 lb

## 2010-12-14 DIAGNOSIS — R748 Abnormal levels of other serum enzymes: Secondary | ICD-10-CM | POA: Insufficient documentation

## 2010-12-14 DIAGNOSIS — E119 Type 2 diabetes mellitus without complications: Secondary | ICD-10-CM

## 2010-12-14 LAB — LIPID PANEL
Cholesterol: 219 mg/dL — ABNORMAL HIGH (ref 0–200)
Total CHOL/HDL Ratio: 4.2 Ratio
VLDL: 40 mg/dL (ref 0–40)

## 2010-12-14 MED ORDER — LISINOPRIL 5 MG PO TABS: 5.0000 mg | ORAL_TABLET | Freq: Every day | ORAL | Status: DC

## 2010-12-14 MED ORDER — METFORMIN HCL 1000 MG PO TABS: 1000.0000 mg | ORAL_TABLET | Freq: Two times a day (BID) | ORAL | Status: DC

## 2010-12-14 MED ORDER — GLIPIZIDE ER 10 MG PO TB24: 10.0000 mg | ORAL_TABLET | Freq: Every day | ORAL | Status: DC

## 2010-12-14 NOTE — Progress Notes (Signed)
Subjective:     [name] is an 34 y.o. female who presents for follow up of diabetes. Current symptoms include: none. Patient denies hypoglycemia , nausea, paresthesia of the feet and vomiting. Evaluation to date has included: fasting lipid panel and hemoglobin A1C. Home sugars: BGs are high in the morning, BGs are high at bedtime, pt states she recently "ran out" of her meds for the past few days. . Current treatments: no recent interventions. Last dilated eye exam ?.  The following portions of the patient's history were reviewed and updated as appropriate: allergies, current medications, past family history, past medical history, past social history, past surgical history and problem list.  Review of Systems Pertinent items are noted in HPI.    Objective:    BP 111/72  Pulse 71  Temp(Src) 99.1 F (37.3 C) (Oral)  Ht 5' 4.5" (1.638 m)  Wt 200 lb (90.719 kg)  BMI 33.80 kg/m2  LMP 11/17/2010 General appearance: alert and cooperative Lungs: clear to auscultation bilaterally Heart: regular rate and rhythm, S1, S2 normal, no murmur, click, rub or gallop Abdomen: soft non tender, not distended, pos bowel sounds  Laboratory: No components found with this basename: A1C      Assessment:    Diabetes mellitus Type II, under poor control.   Hypothyroidism  Plan:    Encouraged aerobic exercise. Labs: fasting lipid panel, hemoglobin A1C and microalbuminuria. Follow up in 2 weeks or as needed.  Hypothyroidism-check TSH, check FLP NAFL-check CMP DM-poorly controlled-will add lisinopril for renal protection, depending on her lab results pt will likely need insulin.  D/w pt at next visit.

## 2010-12-14 NOTE — Progress Notes (Deleted)
  Subjective:    Patient ID: [name], female    DOB: June 26, 1976, 35 y.o.   MRN: [id number]  HPI    Review of Systems     Objective:   Physical Exam        Assessment & Plan:

## 2010-12-14 NOTE — Patient Instructions (Signed)
It was a pleasure to care for you today.  Please take your medication as prescribed.   Return to clinic in 2-3 wks for follow up of diabetes.  Continue diet and exercise.

## 2010-12-15 LAB — COMPLETE METABOLIC PANEL WITH GFR
AST: 29 U/L (ref 0–37)
Albumin: 4.4 g/dL (ref 3.5–5.2)
Alkaline Phosphatase: 98 U/L (ref 39–117)
BUN: 11 mg/dL (ref 6–23)
Potassium: 3.9 mEq/L (ref 3.5–5.3)
Sodium: 138 mEq/L (ref 135–145)
Total Protein: 6.9 g/dL (ref 6.0–8.3)

## 2010-12-15 LAB — MICROALBUMIN / CREATININE URINE RATIO: Microalb, Ur: 0.5 mg/dL (ref 0.00–1.89)

## 2010-12-23 ENCOUNTER — Other Ambulatory Visit: Payer: Self-pay | Admitting: Family Medicine

## 2010-12-23 DIAGNOSIS — E119 Type 2 diabetes mellitus without complications: Secondary | ICD-10-CM

## 2010-12-23 MED ORDER — GLIPIZIDE 10 MG PO TABS: 10.0000 mg | ORAL_TABLET | Freq: Two times a day (BID) | ORAL | Status: DC

## 2010-12-28 ENCOUNTER — Telehealth: Payer: Self-pay | Admitting: Family Medicine

## 2010-12-28 ENCOUNTER — Other Ambulatory Visit: Payer: Self-pay

## 2010-12-28 NOTE — Progress Notes (Signed)
Pt came for fasting labs.  Had the labs on 12-14-10.  Appears to be confusion and misunderstanding, having had labs on 6-12 and being told to come back fasting.  Pt states some confusion with interpreter - what interpreter heard and said and what pt hear herself from MD.  Will not repeat labs but had pt make return appt with MD Dewitt Hoes, MLS

## 2010-12-28 NOTE — Telephone Encounter (Signed)
Patient would like to go back to seeing you as her dr.  She was assigned to [facility] and then [place], but used to see you a while back.  She needs an appt scheduled soon.  I told her we would call and let her know.  Thanks.

## 2011-01-11 NOTE — Telephone Encounter (Signed)
Pt called and requested to change pcp, due  the language barrier, pt saw Dr. Sheffield Slider before and will like to see him again.  We do not have any answer yet from Dr. Sheffield Slider and Pt wants a appt but with dr. Sheffield Slider.

## 2011-01-11 NOTE — Telephone Encounter (Signed)
Dr. Sheffield Slider is not taking new patients.  Please switch to Dr. Aviva Signs.

## 2011-01-27 ENCOUNTER — Encounter: Payer: Self-pay | Admitting: Family Medicine

## 2011-01-27 ENCOUNTER — Ambulatory Visit (INDEPENDENT_AMBULATORY_CARE_PROVIDER_SITE_OTHER): Payer: Self-pay | Admitting: Family Medicine

## 2011-01-27 VITALS — BP 110/80 | HR 64 | Temp 98.7°F | Ht 63.5 in | Wt 193.2 lb

## 2011-01-27 DIAGNOSIS — E669 Obesity, unspecified: Secondary | ICD-10-CM

## 2011-01-27 DIAGNOSIS — E039 Hypothyroidism, unspecified: Secondary | ICD-10-CM

## 2011-01-27 DIAGNOSIS — E119 Type 2 diabetes mellitus without complications: Secondary | ICD-10-CM

## 2011-01-27 DIAGNOSIS — E785 Hyperlipidemia, unspecified: Secondary | ICD-10-CM

## 2011-01-27 MED ORDER — LEVOTHYROXINE SODIUM 150 MCG PO TABS: 150.0000 ug | ORAL_TABLET | Freq: Every day | ORAL | Status: DC

## 2011-01-27 MED ORDER — PRAVASTATIN SODIUM 40 MG PO TABS: 40.0000 mg | ORAL_TABLET | Freq: Every evening | ORAL | Status: DC

## 2011-01-27 MED ORDER — METFORMIN HCL 1000 MG PO TABS: 1000.0000 mg | ORAL_TABLET | Freq: Two times a day (BID) | ORAL | Status: DC

## 2011-01-27 MED ORDER — GLIPIZIDE 10 MG PO TABS: 10.0000 mg | ORAL_TABLET | Freq: Two times a day (BID) | ORAL | Status: DC

## 2011-01-27 NOTE — Progress Notes (Signed)
  Subjective:    Patient ID: [name], female    DOB: 05-03-1976, 35 y.o.   MRN: [id number]  HPI Visit conducted in spanish. Pt with Hx of Diabetes Mellitus and Hypothyroidism that comes for f/u. She does not have any complaints at this point but comments that ran out of her meds. Denies headache, chest pain, numbness or other neurological symptoms. No problems with apetite or GI symptoms. No polydipsia or polyuria. She has newly enrolled in a exercise program (zumba) and has lost 7 pounds in 2 weeks. Nutrition is poor as she eats up to 8 tortillas in a meal. No palpitations or chest pain. No tremors or problems with cold/ hot tolerance. BMI 32.9 Obesity class 1  Review of Systems   Please refer to HPI Objective:   Physical Exam  Constitutional: She is oriented to person, place, and time. She appears well-developed. No distress.  HENT:  Head: Normocephalic and atraumatic.  Mouth/Throat: Oropharynx is clear and moist.  Eyes: Conjunctivae and EOM are normal. Pupils are equal, round, and reactive to light.  Neck: Normal range of motion. Neck supple. No thyromegaly present.  Cardiovascular: Normal rate, regular rhythm and normal heart sounds.   No murmur heard. Pulmonary/Chest: Breath sounds normal. She has no rales.  Abdominal: Soft. Bowel sounds are normal. She exhibits no distension. There is no tenderness.  Musculoskeletal: She exhibits no edema.  Lymphadenopathy:    She has no cervical adenopathy.  Neurological: She is alert and oriented to person, place, and time. She has normal reflexes. No cranial nerve deficit. Coordination normal.  Skin:       Facial decoloration consistent with viltiligo   Foot exam negative for lesions, fungal infection. Sensation intact. Pedal pulses presents and normal.       Assessment & Plan:   No problem-specific assessment & plan notes found for this encounter.

## 2011-01-27 NOTE — Patient Instructions (Addendum)
Ha sido Firefighter. Por favor tome las medicinas de la forma que estan indicadas. [name] cita para en 1 mes  para repetir los analisis de A1c, Lipid Profile y TSH Haga una cita en 3 meses para atender sus problemas de salud.

## 2011-01-28 ENCOUNTER — Encounter: Payer: Self-pay | Admitting: Family Medicine

## 2011-01-28 NOTE — Assessment & Plan Note (Signed)
On 150 mg Levothyroxine. TSH in a month

## 2011-01-28 NOTE — Assessment & Plan Note (Signed)
Some improvement. Will monitor steady weight loss trend. Exercising now. Diet still poor.

## 2011-01-28 NOTE — Assessment & Plan Note (Signed)
A1C 11.0 last month. Not compliant. On metforming and Glipizide. Next month A1c to evaluate if not change consider insulin

## 2011-01-28 NOTE — Assessment & Plan Note (Signed)
Goal under 100. Started Pravachol 7/26 evaluate in a month

## 2011-02-24 ENCOUNTER — Other Ambulatory Visit (INDEPENDENT_AMBULATORY_CARE_PROVIDER_SITE_OTHER): Payer: Self-pay

## 2011-02-24 DIAGNOSIS — E785 Hyperlipidemia, unspecified: Secondary | ICD-10-CM

## 2011-02-24 DIAGNOSIS — E039 Hypothyroidism, unspecified: Secondary | ICD-10-CM

## 2011-02-24 DIAGNOSIS — E119 Type 2 diabetes mellitus without complications: Secondary | ICD-10-CM

## 2011-02-24 LAB — POCT GLYCOSYLATED HEMOGLOBIN (HGB A1C): Hemoglobin A1C: 8.7

## 2011-02-24 LAB — LIPID PANEL
Cholesterol: 200 mg/dL (ref 0–200)
Triglycerides: 154 mg/dL — ABNORMAL HIGH (ref <150)

## 2011-02-24 LAB — TSH: TSH: 5.181 u[IU]/mL — ABNORMAL HIGH (ref 0.350–4.500)

## 2011-02-24 NOTE — Progress Notes (Signed)
Tsh,flp and a1c done today [facility] Delaney Schnick

## 2011-03-01 ENCOUNTER — Telehealth: Payer: Self-pay | Admitting: Family Medicine

## 2011-03-01 NOTE — Telephone Encounter (Signed)
Called patient to let her know results of labs. I recommended to continue with same plan and come back for f/u in 3 months. Pt agreed.

## 2011-04-25 ENCOUNTER — Ambulatory Visit (INDEPENDENT_AMBULATORY_CARE_PROVIDER_SITE_OTHER): Payer: Self-pay | Admitting: Family Medicine

## 2011-04-25 ENCOUNTER — Encounter: Payer: Self-pay | Admitting: Family Medicine

## 2011-04-25 DIAGNOSIS — J40 Bronchitis, not specified as acute or chronic: Secondary | ICD-10-CM

## 2011-04-25 MED ORDER — BENZONATATE 100 MG PO CAPS: 100.0000 mg | ORAL_CAPSULE | Freq: Three times a day (TID) | ORAL | Status: DC | PRN

## 2011-04-25 MED ORDER — AZITHROMYCIN 250 MG PO TABS: ORAL_TABLET | ORAL | Status: AC

## 2011-04-25 NOTE — Patient Instructions (Signed)
May continue antihistamine (claritin) and try a nasal saline spray an/or nasal saline irrigation Lloyd Huger Med Sinus Rinse or Neti Pot)  Cough, Adult  A cough is a reflex that helps clear your throat and airways. It can help heal the body or may be a reaction to an irritated airway. A cough may only last 2 or 3 weeks (acute) or may last more than 8 weeks (chronic).   CAUSES Acute cough:  Viral or bacterial infections.  Chronic cough:  Infections.     Allergies.    Asthma.    Post-nasal drip.     Smoking.    Heartburn or acid reflux.     Some medicines.     Chronic lung problems (COPD).     Cancer.  SYMPTOMS   Cough.     Fever.    Chest pain.     Increased breathing rate.     High-pitched whistling sound when breathing (wheezing).     Colored mucus that you cough up (sputum).  TREATMENT    A bacterial cough may be treated with antibiotic medicine.     A viral cough must run its course and will not respond to antibiotics.     Your caregiver may recommend other treatments if you have a chronic cough.  HOME CARE INSTRUCTIONS    Only take over-the-counter or prescription medicines for pain, discomfort, or fever as directed by your caregiver. Use cough suppressants only as directed by your caregiver.     Use a cold steam vaporizer or humidifier in your bedroom or home to help loosen secretions.     Sleep in a semi-upright position if your cough is worse at night.     Rest as needed.     Stop smoking if you smoke.  SEEK IMMEDIATE MEDICAL CARE IF:    You have pus in your sputum.     Your cough starts to worsen.     You cannot control your cough with suppressants and are losing sleep.     You begin coughing up blood.     You have difficulty breathing.     You develop pain which is getting worse or is uncontrolled with medicine.     You have a fever.  MAKE SURE YOU:    Understand these instructions.     Will watch your condition.     Will get help  right away if you are not doing well or get worse.  Document Released: 12/17/2010 Document Revised: 03/02/2011 Document Reviewed: 12/17/2010 [facility] Patient Information 2012 [place], [place].

## 2011-04-25 NOTE — Progress Notes (Signed)
  Subjective:    Patient ID: [name], female    DOB: [birth year], 35 y.o.   MRN: [id number]  HPI This is a 35 year old Hispanic female who presents with a history of one month of cough. Visit was able to be conducted in in [place]  She reports at the onset was preceded with rhinorrhea, sore throat, low-grade fever,. This resolved although the cough remained. She began having increased rhinorrhea and chest congestion. The coughing has kept her up at night.  She has no dyspnea with occasional sputum production.  No sick contacts.  Non smoker.    I have reviewed patient's  PMH, FH, and Social history and Medications as related to this visit.    Review of Systems she denies history of recent fevers, chills, myalgia, fatigue     Objective:   Physical Exam  GEN: Alert & Oriented, No acute distress HEENT: [place]/AT. EOMI, PERRLA, no conjunctival injection or scleral icterus.  Bilateral tympanic membranes intact without erythema or effusion.  .  Nares with edema.  Oropharynx is without erythema or exudates.  No anterior or posterior cervical lymphadenopathy. CV:  Regular Rate & Rhythm, no murmur Respiratory:  Normal work of breathing, CTAB Abd:  + BS, soft, no tenderness to palpation Ext: no pre-tibial edema      Assessment & Plan:

## 2011-04-25 NOTE — Assessment & Plan Note (Addendum)
Will send in azithromycin x 5 days for cough x 1 month.  Discussed antihistamines, nasale saline irrigation, and tessalon for symptom relief.  Discussed normal course of cough and red flags for return.

## 2011-05-13 ENCOUNTER — Ambulatory Visit (INDEPENDENT_AMBULATORY_CARE_PROVIDER_SITE_OTHER): Payer: Self-pay | Admitting: Family Medicine

## 2011-05-13 ENCOUNTER — Encounter: Payer: Self-pay | Admitting: Family Medicine

## 2011-05-13 VITALS — BP 107/73 | HR 67 | Temp 99.0°F | Ht 62.0 in | Wt 200.0 lb

## 2011-05-13 DIAGNOSIS — R05 Cough: Secondary | ICD-10-CM

## 2011-05-13 DIAGNOSIS — R053 Chronic cough: Secondary | ICD-10-CM | POA: Insufficient documentation

## 2011-05-13 MED ORDER — OMEPRAZOLE 20 MG PO TBEC: 20.0000 mg | DELAYED_RELEASE_TABLET | Freq: Every day | ORAL | Status: DC

## 2011-05-13 MED ORDER — ACETAMINOPHEN-CODEINE #2 300-15 MG PO TABS: 1.0000 | ORAL_TABLET | Freq: Four times a day (QID) | ORAL | Status: AC | PRN

## 2011-05-13 MED ORDER — LOSARTAN POTASSIUM 25 MG PO TABS: 25.0000 mg | ORAL_TABLET | Freq: Every day | ORAL | Status: DC

## 2011-05-13 NOTE — Assessment & Plan Note (Signed)
Chronic cough maybe secondary to multiple factors. Cough symptoms started shortly after patient started taking lisinopril. Will discontinue lisinopril today and start Cozaar 25 mg daily. Patient to followup with PCP in 2-4 weeks to monitor her blood pressure. Because patient complains of chronic cough associated with chest discomfort, will presume she has GERD and start a PPI. Will also give patient a prescription for Tylenol with Codeine to take at bedtime to help suppress cough and help her sleep. Patient to followup with PCP in 2-4 weeks to recheck blood pressure, followup chronic cough. If symptoms do not improve, consider chest x-ray or further imaging at next visit.

## 2011-05-13 NOTE — Patient Instructions (Addendum)
Your cough may be secondary to a couple things. First, cough is a side effect of lisinopril.  Stop taking it. Start taking Cozaar 25 mg by mouth daily. Start taking Omeprazole 20 mg by mouth daily for possible reflux. Start taking Tylenol with codeine at bedtime to suppress cough. Stop if it makes you drowsy. Schedule a follow up appointment with your PCP in 2-4 weeks.

## 2011-05-13 NOTE — Progress Notes (Signed)
  Subjective:    Patient ID: [name], female    DOB: [birth year], 35 y.o.   MRN: [id number]  HPI  Patient presents to clinic for her chronic cough. Cough has been going on for 2 months. She was seen about 3 weeks ago for the same complaint she was given a prescription for Tessalon Perles and 5 days of azithromycin. She completed a course, but states that cough persists. Cough is described as dry, but at times she will cough up clear sputum. 3 weeks ago, she complained of runny nose, congestion, cold symptoms, but these symptoms have resolved.  Patient has been on lisinopril for about 3 months. She also complains of worse cough at nighttime. Sometimes she feels like there is something caught in her throat. She denies any reflux, chest pain, indigestion, or hoarseness. Denies any Fevers, chills, night sweats.  Patient has never smoked.  I have reviewed the patient's past medical history, allergies, medications, problem list, social history.  Review of Systems  Per history of present illness    Objective:   Physical Exam  Constitutional: No distress.  HENT:  Head: Normocephalic and atraumatic.  Mouth/Throat: Oropharynx is clear and moist.  Neck: Normal range of motion. Neck supple.  Cardiovascular: Regular rhythm and normal heart sounds.  Exam reveals no gallop and no friction rub.   No murmur heard. Pulmonary/Chest: Effort normal and breath sounds normal. She has no wheezes. She has no rales.  Lymphadenopathy:    She has no cervical adenopathy.  Skin: Skin is warm. No erythema.          Assessment & Plan:

## 2011-05-31 ENCOUNTER — Ambulatory Visit: Payer: Self-pay | Admitting: Family Medicine

## 2011-06-02 ENCOUNTER — Telehealth: Payer: Self-pay | Admitting: *Deleted

## 2011-06-02 DIAGNOSIS — E039 Hypothyroidism, unspecified: Secondary | ICD-10-CM

## 2011-06-02 MED ORDER — LEVOTHYROXINE SODIUM 150 MCG PO TABS: 150.0000 ug | ORAL_TABLET | Freq: Every day | ORAL | Status: DC

## 2011-06-10 ENCOUNTER — Ambulatory Visit (INDEPENDENT_AMBULATORY_CARE_PROVIDER_SITE_OTHER): Payer: Self-pay | Admitting: Family Medicine

## 2011-06-10 DIAGNOSIS — E119 Type 2 diabetes mellitus without complications: Secondary | ICD-10-CM

## 2011-06-10 LAB — POCT GLYCOSYLATED HEMOGLOBIN (HGB A1C): Hemoglobin A1C: 9.1

## 2011-06-10 NOTE — Patient Instructions (Signed)
It has been a pleasure to see you today. Please take the medications as prescribed. I will call you with the labs results if they come back abnormal otherwise you will receive a letter. Make your next appointment in 1 month or sooner if you need it.

## 2011-06-15 NOTE — Progress Notes (Signed)
  Subjective:    Patient ID: [name], female    DOB: July 21, 1975, 35 y.o.   MRN: [id number]  HPI  Review of Systems     Objective:   Physical Exam        Assessment & Plan:

## 2011-06-16 ENCOUNTER — Encounter: Payer: Self-pay | Admitting: Family Medicine

## 2011-06-16 ENCOUNTER — Ambulatory Visit (INDEPENDENT_AMBULATORY_CARE_PROVIDER_SITE_OTHER): Payer: Self-pay | Admitting: Family Medicine

## 2011-06-16 VITALS — BP 116/76 | HR 72 | Temp 98.7°F | Ht 62.0 in | Wt 200.0 lb

## 2011-06-16 DIAGNOSIS — E119 Type 2 diabetes mellitus without complications: Secondary | ICD-10-CM

## 2011-06-16 DIAGNOSIS — E785 Hyperlipidemia, unspecified: Secondary | ICD-10-CM

## 2011-06-16 DIAGNOSIS — E039 Hypothyroidism, unspecified: Secondary | ICD-10-CM

## 2011-06-16 DIAGNOSIS — Z23 Encounter for immunization: Secondary | ICD-10-CM

## 2011-06-16 NOTE — Assessment & Plan Note (Addendum)
On Pravachol 40 mg. Compliant Plan: Recheck lipid profile in three months. Diet and exercise regimen discussed.

## 2011-06-16 NOTE — Patient Instructions (Addendum)
Ha sido un placer verle hoy. recuerde lo que hablamos acerca de dieta y ejercicios. Continue con su tratamiento. No vamos a Sales executive. [name] cita para seguimiento en 3 meses y unos marque una cita antes para [employer]. Necesita hacerse el papa nicolau. Po favor marque una cita espacifica para eso. Siga con la buena actitud en cambiar su estilo de vida. ( dieta y ejercicio )

## 2011-06-16 NOTE — Assessment & Plan Note (Addendum)
On Synthroid 150 mcg daily. Compliant. Plan: Continue thyroid replacement plan. Recheck TSH in 3 months.

## 2011-06-16 NOTE — Progress Notes (Signed)
Subjective:     Patient ID: [name], female   DOB: [birth year], 35 y.o.   MRN: [id number]  HPI Consult conducted in Spanish. Pt comes today for follow up her chronic conditions.  Diabetes: Compliant with medications but lately has not followed a good diet plan and has decressed the level of exercise. No symptoms of hypoglycemia. No secondary effects of oral hypoglycemic medications. Pt with Hx of hypertension but we could not find an elevated blood pressure readings on any of the visits since 2009 reported in this EMR. She was taking Lisinopril and Losartan. A couple of months ago she developed a cough that did not alleviated with tessalon pearls and antihistaminic treatment but completely resolved after discontinuing ACEIs and ARBs. Now she has been with no HTN medication treatment and pressure still in 110's /70's. No chest pain, palpitation, change in vision or headaches. Hypothyroidism: Asymptomatic, last TSH 5.1 in August. She is compliant with Synthroid. No difficulty swallowing, no dysphonia.  Pending [name] Smear: Pt will schedule a visit for this purpose.   Review of Systems Per HPI.     Objective:   Physical Exam  Constitutional: She is oriented to person, place, and time. No distress.  HENT:  Head: Normocephalic and atraumatic.  Mouth/Throat: Oropharynx is clear and moist.  Eyes: EOM are normal. Pupils are equal, round, and reactive to light.       Normal fundoscopy bilaterally.  Neck: Normal range of motion. Neck supple. No tracheal deviation present. No thyromegaly present.  Cardiovascular: Normal rate, regular rhythm and normal heart sounds.   Pulmonary/Chest: Breath sounds normal. She has no wheezes. She has no rales.  Abdominal: Bowel sounds are normal. She exhibits no mass. There is no tenderness.  Musculoskeletal: She exhibits no edema.       Foot Exam: Normal sensation in both lower extremities. No lesions. Normal pedal pulses bilaterally.   Lymphadenopathy:      She has no cervical adenopathy.  Neurological: She is alert and oriented to person, place, and time. She has normal reflexes. No cranial nerve deficit.  Skin: Skin is warm and dry.  Psychiatric: She has a normal mood and affect. Her behavior is normal.       Assessment:        Plan:

## 2011-06-16 NOTE — Assessment & Plan Note (Addendum)
A1C on 9.1 Was 11.0 in 6/12 and Pt was able to decreased to 8.7 in two moths. Pt was exercising intensively at that point.  Plan: Goals of A1C below 7. Pt  Motivated to work on this and increase exercise level. Discussed that the next step will be insulin if glucose is not well controlled. Pt voiced understanding. Not clear the hx of HTN, it is possible that ACEIs was for kidney protection; now is not an option due to secondary effect with cough.

## 2011-06-20 ENCOUNTER — Ambulatory Visit: Payer: Self-pay | Admitting: Family Medicine

## 2011-07-06 IMAGING — US US OB FOLLOW-UP
1 series · 14 of 28 positions shown · non-contrast
Comparison: none

OBSTETRICAL ULTRASOUND:
 This ultrasound exam was performed in the [HOSPITAL] Ultrasound Department.  The OB US report was generated in the AS system, and faxed to the ordering physician.  This report is also available in [HOSPITAL]?s AccessANYware and in [REDACTED] PACS.

[Series 1: us ob follow up · 0.25mm/px · 14 of 46 slices shown]
[im 2/46]
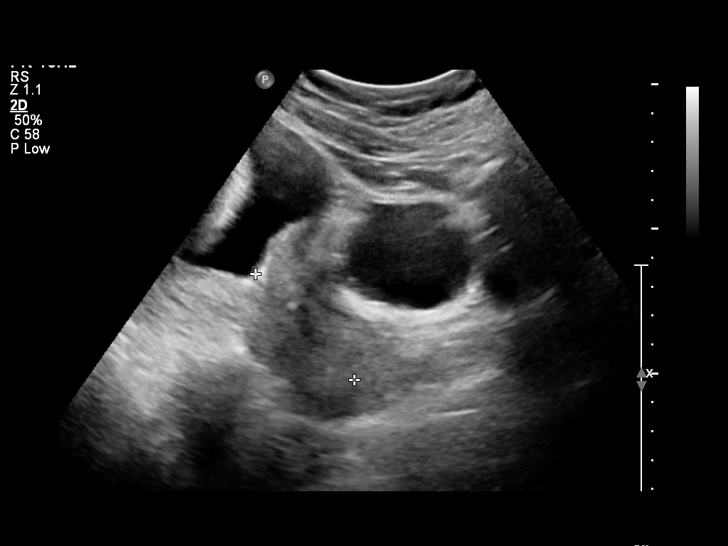
[im 6/46]
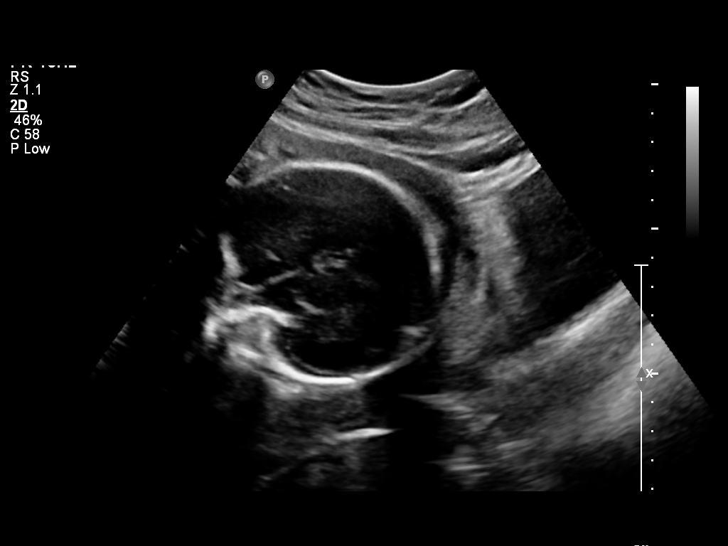
[im 9/46]
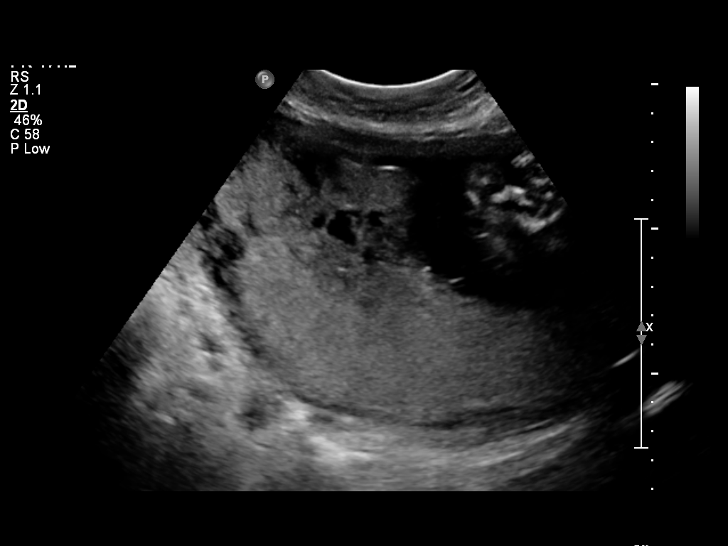
[im 12/46]
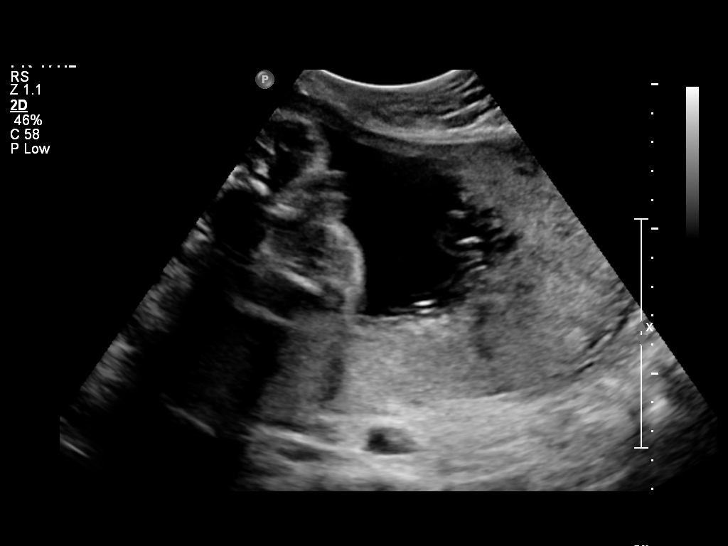
[im 16/46]
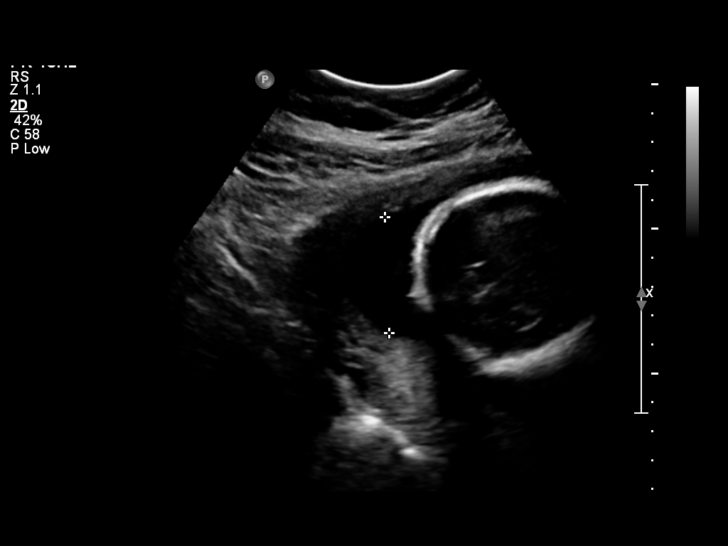
[im 19/46]
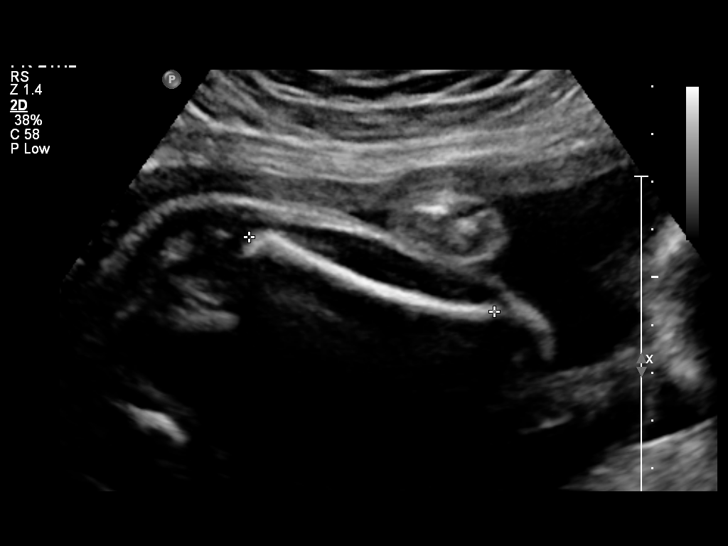
[im 22/46]
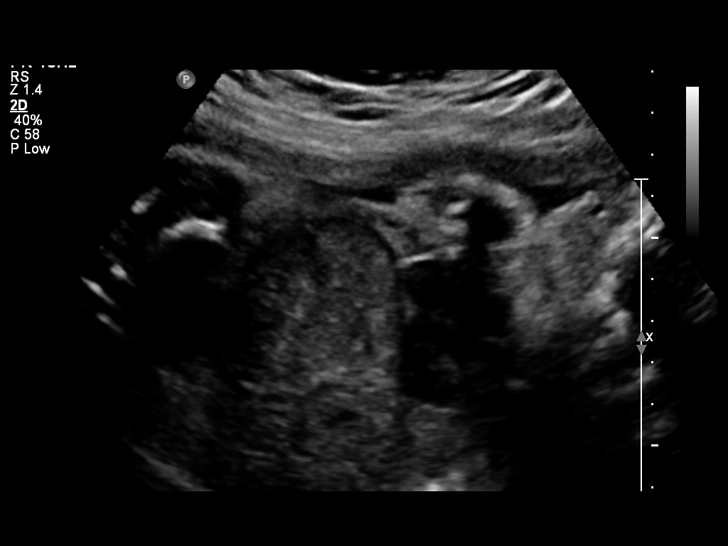
[im 26/46]
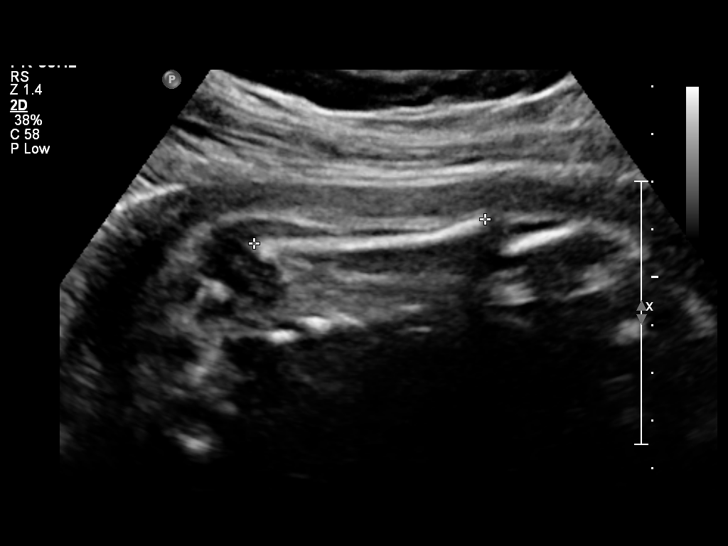
[im 29/46]
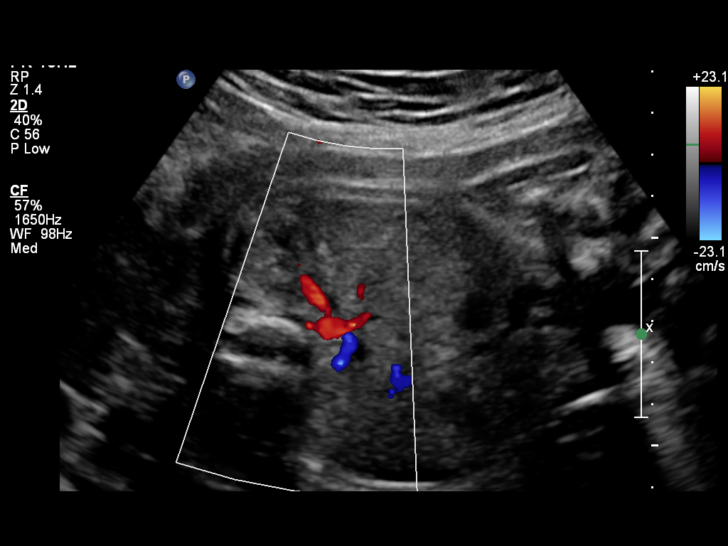
[im 32/46]
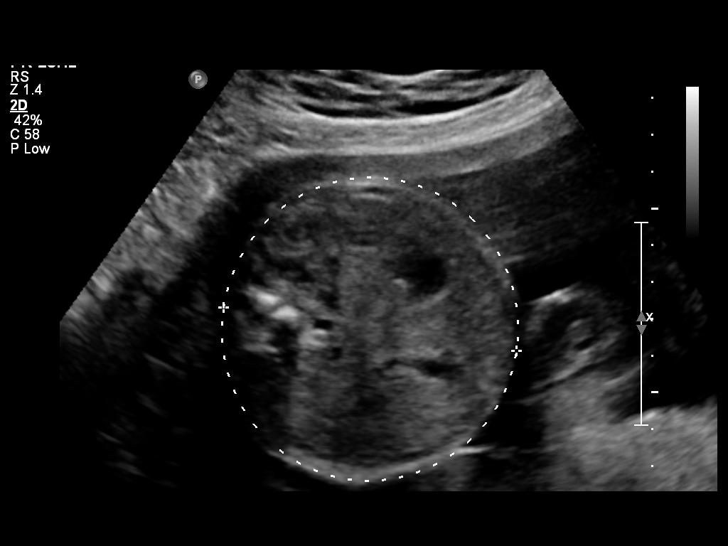
[im 36/46]
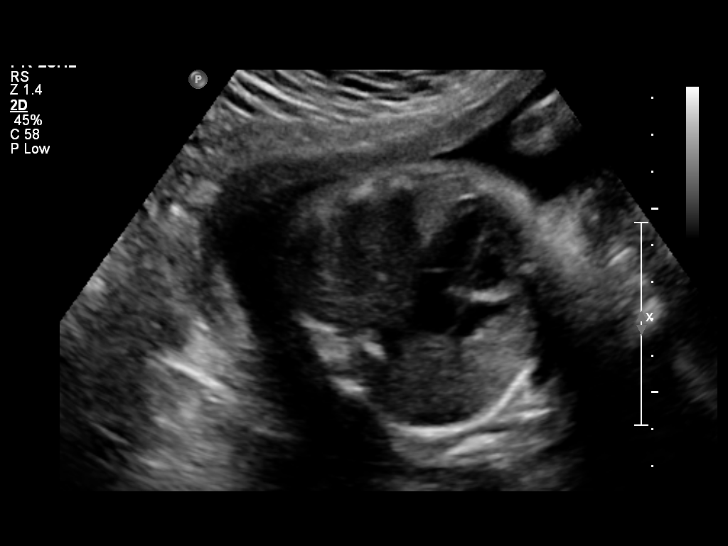
[im 39/46]
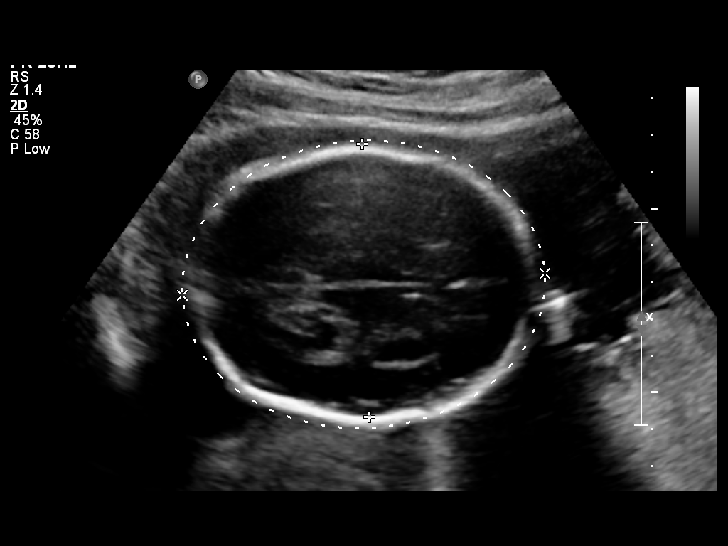
[im 42/46]
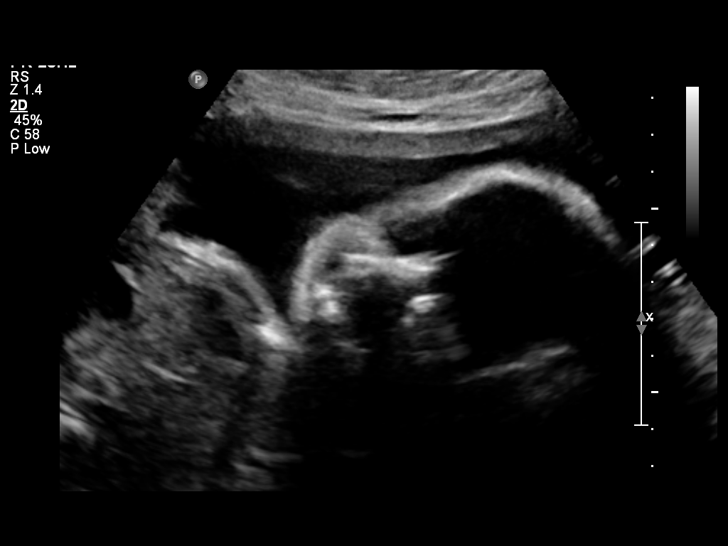
[im 46/46]
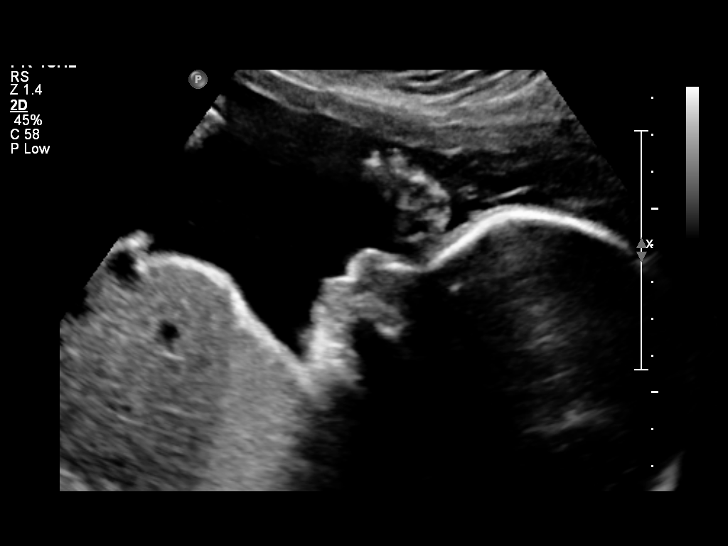

[14 of 28 positions shown; findings below may reference images not displayed]

IMPRESSION: See AS Obstetric US report.

## 2011-07-08 ENCOUNTER — Other Ambulatory Visit: Payer: Self-pay | Admitting: Family Medicine

## 2011-07-08 ENCOUNTER — Telehealth: Payer: Self-pay | Admitting: Family Medicine

## 2011-07-08 ENCOUNTER — Telehealth: Payer: Self-pay | Admitting: *Deleted

## 2011-07-08 DIAGNOSIS — E119 Type 2 diabetes mellitus without complications: Secondary | ICD-10-CM

## 2011-07-08 DIAGNOSIS — E039 Hypothyroidism, unspecified: Secondary | ICD-10-CM

## 2011-07-08 MED ORDER — GLIPIZIDE 10 MG PO TABS: 10.0000 mg | ORAL_TABLET | Freq: Two times a day (BID) | ORAL | Status: DC

## 2011-07-08 NOTE — Telephone Encounter (Signed)
Pt called [place] requested a refill. I called pt and let VM. Pt needs to call pharmacy and pharmacy will send the request to our clinic for pt's Dr to complete .  Marines

## 2011-09-29 ENCOUNTER — Ambulatory Visit (INDEPENDENT_AMBULATORY_CARE_PROVIDER_SITE_OTHER): Payer: Self-pay | Admitting: Family Medicine

## 2011-09-29 ENCOUNTER — Other Ambulatory Visit (HOSPITAL_COMMUNITY)
Admission: RE | Admit: 2011-09-29 | Discharge: 2011-09-29 | Disposition: A | Discharge status: Home / Self Care | Payer: Self-pay | Source: Ambulatory Visit | Attending: Family Medicine | Admitting: Family Medicine

## 2011-09-29 VITALS — BP 120/85 | HR 88 | Temp 98.3°F | Resp 18 | Ht 62.0 in | Wt 207.0 lb

## 2011-09-29 DIAGNOSIS — Z01419 Encounter for gynecological examination (general) (routine) without abnormal findings: Secondary | ICD-10-CM | POA: Insufficient documentation

## 2011-09-29 DIAGNOSIS — E039 Hypothyroidism, unspecified: Secondary | ICD-10-CM

## 2011-09-29 DIAGNOSIS — M79609 Pain in unspecified limb: Secondary | ICD-10-CM

## 2011-09-29 DIAGNOSIS — E119 Type 2 diabetes mellitus without complications: Secondary | ICD-10-CM

## 2011-09-29 DIAGNOSIS — M79643 Pain in unspecified hand: Secondary | ICD-10-CM

## 2011-09-29 DIAGNOSIS — Z124 Encounter for screening for malignant neoplasm of cervix: Secondary | ICD-10-CM

## 2011-09-29 DIAGNOSIS — E669 Obesity, unspecified: Secondary | ICD-10-CM

## 2011-09-29 DIAGNOSIS — E785 Hyperlipidemia, unspecified: Secondary | ICD-10-CM

## 2011-09-29 LAB — POCT GLYCOSYLATED HEMOGLOBIN (HGB A1C): Hemoglobin A1C: 9.7

## 2011-09-30 LAB — COMPREHENSIVE METABOLIC PANEL
Alkaline Phosphatase: 117 U/L (ref 39–117)
BUN: 8 mg/dL (ref 6–23)
Creat: 0.75 mg/dL (ref 0.50–1.10)
Glucose, Bld: 385 mg/dL — ABNORMAL HIGH (ref 70–99)
Total Bilirubin: 0.4 mg/dL (ref 0.3–1.2)

## 2011-09-30 LAB — LIPID PANEL
Cholesterol: 215 mg/dL — ABNORMAL HIGH (ref 0–200)
HDL: 53 mg/dL (ref >39)
Total CHOL/HDL Ratio: 4.1 Ratio

## 2011-10-01 ENCOUNTER — Encounter: Payer: Self-pay | Admitting: Family Medicine

## 2011-10-01 DIAGNOSIS — M79643 Pain in unspecified hand: Secondary | ICD-10-CM | POA: Insufficient documentation

## 2011-10-01 MED ORDER — LEVOTHYROXINE SODIUM 175 MCG PO TABS: 175.0000 ug | ORAL_TABLET | Freq: Every day | ORAL | Status: DC

## 2011-10-01 NOTE — Assessment & Plan Note (Signed)
TSH increased from 5.1 to 15.143. This is a marked elevation since past August. Patient reports being compliant with her levothyroxine on 150 mcg daily. She used to take up to 200 mcg in the past. This was found be to much, and she stayed for a while on 175 mcg. Her TSH at that point was controlled and levothyroxine was decreased to .  Plan: We will go up in the dose of levothyroxine to 175 mcg daily.  Will recheck in 6 weeks. TSH ordered future. Patient notified.

## 2011-10-01 NOTE — Patient Instructions (Signed)
Incremente levothyroxine a 175 mcg diarios y haga una cita para laboratorio en 6 semanas. El problemas de las manos puede ser circulatorio con retencion de fluido debido a su tiroides. si no mejora despues que se controle la tiroides y el peso entoces volveremos a Lobbyist sobre ese tema. o si empeora tambien seria bueno que hiciera una cita para ello. Tiene pendiente el examen de colesterol. Yo la llamoo le envio una carta cuando los resultados esten diaponibles.   Diabetes tipo 2 (Diabetes, Type 2) INSTRUCCIONES PARA EL CUIDADO DOMICILIARIO  Controle su nivel de glucosa en sangre (azcar) al menos una vez al da. Puede ser necesario que realice controles ms frecuentes, segn los medicamentos que toma y el xito en el control de la diabetes. El profesional lo ayudar.   Tome la [employer] le ha indicado el profesional que lo asiste.   Elija cuidadosamente los alimentos. Pida informacin a su mdico. La prdida de peso puede mejorar la diabetes.   Investigue acerca del nivel bajo de azcar en sangre (hipoglucemia) y aprenda cmo tratarlo.   Hgase un examen de la vista con regularidad.   Concurra para un examen fsico una vez por ao. Controle su presin arterial. Haga anlisis de sangre y [place].   Use un colgante o una pulsera que indique que es diabtico.   Controle sus pies todas las noches para observar si hay cortes, llagas, ampollas o enrojecimiento. Hable con el profesional que lo asiste si tiene algn problema.  SOLICITE ATENCIN MDICA SI:  Tiene problemas para Futures trader de glucosa en el rango indicado.   Siente efectos adversos por los medicamentos prescriptos.   Tiene sntomas de enfermedad que no mejoran en 24 horas.   Tiene una llaga o herida que no se cura.   Nota cambios o un nuevo problema en la visin.   Tiene fiebre.   Hipotiroidismo (Hypothyroidism) El hipotiroidismo es un trastorno causado por una escasa actividad de la glndula  tiroides. CAUSAS La causa del hipotiroidismo puede ser Neomia Dear enfermedad o Neomia Dear ciruga de tiroides.  SNTOMAS Generalmente son vagos. Ellos puede ser:  Alma Friendly.   Retraso mental.   Debilidad.   Voz ronca.   Constipacin.   Hinchazn de la cara, manos y pies   Piel seca.   Prdida del cabello.   Sensibilidad al fro.   Problemas en el ciclo menstrual.   Es ms probable que estos pacientes sufran infecciones.  DIAGNSTICO El diagnstico se confirma con pruebas de laboratorio para la hormona tiroides (T3, T4, TSH). Estas pruebas tambin se utilizan para Conservation officer, nature. TRATAMIENTO ste consiste en la reposicin gradual de la hormona tiroides. Es muy importante que contine diariamente con los medicamentos que le han prescripto, an despus de sentirse mejor. Probablemente necesite la hormona tiroidea por el resto de su vida. Para reducir la constipacin, ingiera una dieta alta en frutas y verduras. Trate de Materials engineer [employer]. Comunquese con su mdico para Special educational needs teacher de modo que el tratamiento pueda ajustarse a medida que el problema [place].  SOLICITE ATENCIN MDICA DE INMEDIATO SI: Comienza a sentir dolor en el pecho, palpitaciones, falta de aire, u otros sntomas serios. ASEGRESE DE QUE:   Comprende estas instrucciones.   Controlar su enfermedad.   Solicitar ayuda de inmediato si no mejora o si empeora.  Document Released: 06/20/2005 Document Revised: 06/09/2011 [facility] Patient Information 2012 [place], [place].

## 2011-10-01 NOTE — Assessment & Plan Note (Signed)
It is mild and the predominant discomfort is mild numbness bilaterally on the medial nerve distribution: Physical exam is negative for signs of Carpal Tunnel Syndrome. The fact it is bilateral in the differential we consider diabetic neuropathy vs secondary to hand edema from uncontrolled hypothyroidism. Plan: We will treat possible secondary etiology and if this continues or worsen we will consider start gabapentin. Red flags of worsening condition (change in coloration, increase of pain, weakness) discussed with pt. Pt aware and voices understanding. Pt aware of this to take precautions and avoid secondary injury (burning, trauma)

## 2011-10-01 NOTE — Progress Notes (Signed)
Subjective:    Patient ID: [name], female    DOB: [birth year], 36 y.o.   MRN: [id number]  HPI Pt that comes today for a complete physical. Visit conducted in Spanish Her complaint today is her intermittent bilateral numbness of upper extremities with median nerve distribution. She has hx of carpal tunnel syndrome on her left hand in the past. This numbness is not acute, she has had this since her last pregnancy while she described that it was so bad she couldn'd sleep. Now the numbness is not that intense but since she has lately gaining weight this symptom started to come back. No numbness/tingling reported on her lower extremities. 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  #1 Diabetes: uncontrolled. A1C 9.7  Has worsen from last visit. Pt confirms she is not dieting as she should and she is eating flour tortillas "more than she should". Does not precise quantity per day. Has increase weight of 7 lb since December/12 (3 months). She also stopped doing exercise. Continue with metformin and glipizide, reports to be compliant and no side effects. #2 Hypothyroidism: on levothyroxine, no side effects. No palpitations, hair loss, symptoms of depression. Reports compliance and no side effects. #3 Obesity: Has increased 7 pounds since the last visit. She's not currently following a healthy diet. She used to go to [employer], but has discontinued this activity for the past couple of months. #5 Hyperlipidemia: On Pravachol 40 mg. No side effects. No effective diet regimen.   Review of Systems  Constitutional: Negative for chills, activity change, appetite change, fatigue and unexpected weight change.  HENT: Negative.   Eyes: Negative for redness and visual disturbance.  Respiratory: Negative.   Cardiovascular: Negative for chest pain, palpitations and leg swelling.    Gastrointestinal: Negative.   Musculoskeletal: Negative.   Neurological: Negative.        Objective:   Physical Exam  Constitutional: She is oriented to person, place, and time. No distress.  HENT:  Mouth/Throat: Oropharynx is clear and moist.  Eyes: Conjunctivae and EOM are normal. Pupils are equal, round, and reactive to light.       Funduscopy: Negative for hemorrhages or papillar edema. Examined by optometrist with no changes on lenses needed for the past 3 years.  Visual fields intact.  Neck: Normal range of motion. Neck supple. No tracheal deviation present. No thyromegaly present.  Cardiovascular: Normal rate, regular rhythm and normal heart sounds.  Exam reveals no gallop and no friction rub.   No murmur heard. Pulmonary/Chest: Effort normal and breath sounds normal. She has no wheezes. She has no rales.  Abdominal: Soft. Bowel sounds are normal. She exhibits no mass. There is no tenderness.  Genitourinary: Vagina normal and uterus normal.       [name] smears performed. Normal vaginal discharge Cervix: With no erythema, or macroscopic lesions. Bimanual exam: Retroverted uterus, normal adnexa, no pain to cervical motion.  Musculoskeletal: She exhibits no edema and no tenderness.       Normal ROM of upper extremities.  Tinel sign bilaterally negative. Mild decrease of 2 point discrimination sensation on the medial nerve innervation. (First, second and half medial of third finger). No motor abnormality  Lymphadenopathy:    She has no cervical adenopathy.  Neurological: She is alert and oriented to person, place, and time. She has normal reflexes. No cranial nerve deficit.  Skin:  Skin is warm and dry.          Assessment & Plan:

## 2011-10-01 NOTE — Assessment & Plan Note (Addendum)
A1c has been increasing progressively to 9.7. Poor diet and exercise. Normal blood pressure. ACE inhibitors at this point is not an option for kidney protection since patient developed secondary effect (cough).  Plan: Patient motivated to increase exercise level and go back to a healthy diet. We discussed insulin on options but patient declines insulin at this point.  We will continue current treatment but since has maxed out glipizide and metformin doses the other option will be to add sitagliptin to her regimen. Pt is self paid, we will discuss for pt to apply online to [facility] to see if pt qualifies for Janumet. We will continue to monitor and asses next visit.

## 2011-10-01 NOTE — Assessment & Plan Note (Signed)
Class II (BMI 35-39.9) she increased 1.8 Kg/M2 in 3 months. Concerning more due to her DM and Hypothyroidism. No following diet/ exercise plan. Plan: Counseling given about nutrition status and resources to help with weight management. Pt voices understanding and is motivated to take action in reducing her weight.

## 2011-10-01 NOTE — Assessment & Plan Note (Addendum)
History of LDL on 112, obesity and diabetes. On Pravachol 40 mg. Pt reports compliance. Plan: We'll recheck lipid panel.

## 2011-11-15 ENCOUNTER — Emergency Department (INDEPENDENT_AMBULATORY_CARE_PROVIDER_SITE_OTHER)
Admission: EM | Admit: 2011-11-15 | Discharge: 2011-11-15 | Disposition: A | Discharge status: Home / Self Care | Payer: Self-pay | Source: Home / Self Care | Attending: Family Medicine | Admitting: Family Medicine

## 2011-11-15 ENCOUNTER — Encounter (HOSPITAL_COMMUNITY): Payer: Self-pay | Admitting: Emergency Medicine

## 2011-11-15 ENCOUNTER — Emergency Department (INDEPENDENT_AMBULATORY_CARE_PROVIDER_SITE_OTHER): Payer: Self-pay

## 2011-11-15 DIAGNOSIS — S139XXA Sprain of joints and ligaments of unspecified parts of neck, initial encounter: Secondary | ICD-10-CM

## 2011-11-15 HISTORY — DX: Disorder of thyroid, unspecified: E07.9

## 2011-11-15 HISTORY — DX: Hyperlipidemia, unspecified: E78.5

## 2011-11-15 MED ORDER — HYDROCODONE-ACETAMINOPHEN 5-500 MG PO TABS: 1.0000 | ORAL_TABLET | Freq: Four times a day (QID) | ORAL | Status: AC | PRN

## 2011-11-15 MED ORDER — IBUPROFEN 600 MG PO TABS: 600.0000 mg | ORAL_TABLET | Freq: Three times a day (TID) | ORAL | Status: AC
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
## 2011-11-15 NOTE — Discharge Instructions (Signed)
No hay evidencia de fracturas o dao en los huesos de su cuello en los rayos X. Tome los medicamentos como se le ha prescrito. Debe saber que no debe manejar despues de tomar vicodin ya que [employer] puede causar somnolencia.  Debe tomar Ibuprofeno con las comidas ya que le puede irritar el Bolan. Regrese o haga seguinmiento con du medico primario si persisten sus sintomas por mas de 1 semana, o vaya con el orthopeda (numero de contacto arriba) si empeora sus sintomas a pesar de Radio broadcast assistant se le indico.

## 2011-11-26 DIAGNOSIS — R42 Dizziness and giddiness: Secondary | ICD-10-CM

## 2011-11-26 DIAGNOSIS — G459 Transient cerebral ischemic attack, unspecified: Secondary | ICD-10-CM

## 2012-02-08 ENCOUNTER — Other Ambulatory Visit: Payer: Self-pay | Admitting: Family Medicine

## 2012-02-08 DIAGNOSIS — E785 Hyperlipidemia, unspecified: Secondary | ICD-10-CM

## 2012-02-08 MED ORDER — PRAVASTATIN SODIUM 10 MG PO TABS: 10.0000 mg | ORAL_TABLET | Freq: Every day | ORAL | Status: DC

## 2012-02-08 NOTE — Progress Notes (Signed)
Changed dose of medication since last lipid profile pt's LDL was on 100.

## 2012-02-28 ENCOUNTER — Other Ambulatory Visit: Payer: Self-pay | Admitting: *Deleted

## 2012-02-28 DIAGNOSIS — E119 Type 2 diabetes mellitus without complications: Secondary | ICD-10-CM

## 2012-02-28 MED ORDER — METFORMIN HCL 1000 MG PO TABS: 1000.0000 mg | ORAL_TABLET | Freq: Two times a day (BID) | ORAL | Status: DC

## 2012-02-28 MED ORDER — GLIPIZIDE 10 MG PO TABS: 10.0000 mg | ORAL_TABLET | Freq: Two times a day (BID) | ORAL | Status: DC

## 2012-05-14 ENCOUNTER — Encounter: Payer: Self-pay | Admitting: Home Health Services

## 2012-05-15 ENCOUNTER — Encounter: Payer: Self-pay | Admitting: Home Health Services

## 2012-07-09 ENCOUNTER — Ambulatory Visit (INDEPENDENT_AMBULATORY_CARE_PROVIDER_SITE_OTHER): Payer: No Typology Code available for payment source | Admitting: Family Medicine

## 2012-07-09 ENCOUNTER — Encounter: Payer: Self-pay | Admitting: Family Medicine

## 2012-07-09 VITALS — BP 110/76 | HR 69 | Ht 62.0 in | Wt 202.7 lb

## 2012-07-09 DIAGNOSIS — E039 Hypothyroidism, unspecified: Secondary | ICD-10-CM

## 2012-07-09 DIAGNOSIS — IMO0002 Reserved for concepts with insufficient information to code with codable children: Secondary | ICD-10-CM

## 2012-07-09 DIAGNOSIS — E119 Type 2 diabetes mellitus without complications: Secondary | ICD-10-CM

## 2012-07-09 DIAGNOSIS — E1165 Type 2 diabetes mellitus with hyperglycemia: Secondary | ICD-10-CM

## 2012-07-09 DIAGNOSIS — E785 Hyperlipidemia, unspecified: Secondary | ICD-10-CM

## 2012-07-09 MED ORDER — GLIPIZIDE 10 MG PO TABS: 10.0000 mg | ORAL_TABLET | Freq: Two times a day (BID) | ORAL | Status: DC

## 2012-07-09 MED ORDER — PRAVASTATIN SODIUM 40 MG PO TABS
40.0000 mg | ORAL_TABLET | Freq: Every day | ORAL | Status: DC
Start: 2012-07-09 — End: 2013-05-23

## 2012-07-09 MED ORDER — LEVOTHYROXINE SODIUM 175 MCG PO TABS: 175.0000 ug | ORAL_TABLET | Freq: Every day | ORAL | Status: DC

## 2012-07-09 MED ORDER — METFORMIN HCL 1000 MG PO TABS: 1000.0000 mg | ORAL_TABLET | Freq: Two times a day (BID) | ORAL | Status: DC

## 2012-07-09 NOTE — Patient Instructions (Addendum)
Tu azucar en la sangre esta MUY ELEVADA!Marland Kitchen Necesita empezar tratamiento con la insulina. Por favor tome los [employer] tienen indicados y vega en 4 semanas para volver a Development worker, community su condicion.

## 2012-07-09 NOTE — Progress Notes (Signed)
Family Medicine Office Visit Note   Subjective:   Patient ID: Annmarie Plemmons, female  DOB: 09-Oct-1975, 37 y.o.. MRN: [id number]   Pt that comes today to f/u her chronic conditions. She has been taking care of her sister in [place] for the past 6 months and has not been compliant with her medications. 1. DM: Denies symptoms of hypo or hyper glycemia.  2. Hypothyroidism: Dose was increased 3. Pt inform [place] that she has Implanon for Birth Control that expires on April 2014. She is following at [facility] since she is interested on MIRENA (IUD) and it will be free at their facility. Plans are for insertion of IUD next month and a month later retrieving Implanon.     Review of Systems:  Pt denies SOB, chest pain, palpitations, headaches, dizziness, numbness or weakness. No changes on urinary or BM habits. No unintentional weigh loss/gain.  Objective:   Physical Exam: Gen:  NAD HEENT: Moist mucous membranes  CV: Regular rate and rhythm, no murmurs. PULM: Clear to auscultation bilaterally.  ABD: Soft, non tender, non distended, normal bowel sounds EXT: No edema Neuro: Alert and oriented x3. No focalization  Assessment & Plan:

## 2012-07-09 NOTE — Assessment & Plan Note (Signed)
Last TSH 09/2011 with increased on Levothyroxine to . Unsure about compliance. Plan: TSH and will adjust levothyroxine. (pt has been as high as 200 mcg in the past)

## 2012-07-09 NOTE — Assessment & Plan Note (Signed)
A1C 13.1. DISCUSSED IN DEPTH with pt. This is indication for insulin and the risks of not having adequate control of her glycemia were extensively addressed. Pt once more refuses insulin treatment alleging that she has not been compliant as the cause of her elevated A1C.   Plan: Refill on her meds and CLOSE F/U CBC, CMET, Lipid Profile.

## 2012-08-07 ENCOUNTER — Ambulatory Visit (INDEPENDENT_AMBULATORY_CARE_PROVIDER_SITE_OTHER): Payer: No Typology Code available for payment source | Admitting: Family Medicine

## 2012-08-07 ENCOUNTER — Encounter: Payer: Self-pay | Admitting: Family Medicine

## 2012-08-07 VITALS — BP 106/73 | HR 70 | Temp 98.3°F | Wt 201.9 lb

## 2012-08-07 DIAGNOSIS — E1165 Type 2 diabetes mellitus with hyperglycemia: Secondary | ICD-10-CM

## 2012-08-07 DIAGNOSIS — E039 Hypothyroidism, unspecified: Secondary | ICD-10-CM

## 2012-08-07 LAB — GLUCOSE, CAPILLARY: Glucose-Capillary: 221 mg/dL — ABNORMAL HIGH (ref 70–99)

## 2012-08-07 LAB — POCT GLYCOSYLATED HEMOGLOBIN (HGB A1C): Hemoglobin A1C: 11

## 2012-08-07 NOTE — Patient Instructions (Addendum)
Tu A1c que mide la azucar en tu sangre ha mejorado pero todavia est alta. Es super importante que hagas la dieta de diabetes lo mejor que puedas y tambine que hagas ejercicio. Continua tomando tus medicinas de la forma recetada. Haz una cita conmigo en 8 semanas. Has una cita de laboratorio una semana antes de la cita conmigo.  Diabetes y Doroteo Glassman fsica (Diabetes and Exercise) La actividad fsica regular es muy importante y [place] a:   Chief Operating Officer el nivel de glucosa en sangre (azcar).  Disminuir la presin arterial.  Controlar el colesterol en sangre (colesterol y triglicridos).  Mejorar el estado de salud general. BENEFICIOS DE LA ACTIVIDAD FSICA  Mejora el buen estado fsico.  Mejora la flexibilidad.  Aumenta la resistencia.  Aumenta la densidad sea.  Favorece el control del peso.  Aumenta la fuerza muscular.  Disminuye la Art gallery manager.  Mejora la utilizacin de la insulina por parte del organismo.  Aumenta la sensibilidad a la insulina.  Reduce las necesidades de insulina.  Har que se sienta mejor.  Reduce el estrs y las tensiones. Las personas diabticas que incorporan la actividad fsica a su estilo de vida obtienen beneficios adicionales.  Prdida de peso.  Reduccin del apetito.  Mejora la utilizacin de la glucosa por parte del organismo.  Disminuye los factores de riesgo para las enfermedades cardacas.  Disminuye el colesterol y los triglicridos.  Eleva el nivel de colesterol bueno (lipoproteinas de alta densidad HDL).  Disminuye el nivel de Banker.  Disminuye la presin arterial. DIABETES TIPO I Y ACTIVIDAD FSICA  La actividad fsica disminuir el nivel de glucosa en [place].  Si el nivel de glucosa en sangre es de ms de 240 mg/dl, controle las cetonas en la [place]. Si hay cetonas, no realice actividad fisica.  El sitio de inyeccin de la insulina puede requerir un ajuste cuando se realiza actividad fsica. Evite  inyectarse insulina en las zonas del cuerpo que ejercitar. Por ejemplo, evite inyectarse insulina en:  Los brazos, si juega al tenis.  Las piernas, si corre. Para obtener ms informacion, consulte a su mdico.  Lleve un registro de:  La ingesta de alimentos.  El tipo y cantidad de [place].  Los momentos esperables de picos de accin de la insulina.  Los niveles de Event organiser. Hgalo antes, durante y despus de Printmaker actividad fsica. Verifique los registros junto con su mdico. Esto ser de utilidad para la confeccin de pautas para ajustar la ingesta de alimentos o las cantidades de [place].  DIABETES TIPO 2 Y ACTIVIDAD FSICA  La actividad fsica regular ayuda a controlar el nivel de glucosa en [place].  La actividad fsica es importante porque:  Aumenta la sensibilidad del organismo a la insulina.  Mejora el control del nivel de Event organiser.  Reduce el riesgo de enfermedades cardiacas. Disminuye el colesterol srico y los triglicridos. Disminuye la presin arterial.  Las personas que reciben insulina o agentes hipoglucemiantes por va oral deben controlar la aparicin de signos de hipoglucemia (mareos, temblores, sudoracin, escalofros y confusin).  Durante la actividad fsica se pierde Data processing manager. Esta prdida de lquidos debe reponerse. De este modo se evita la prdida de lquidos corporales (deshidratacin) y el golpe de Airline pilot. Comente con su mdico antes de comenzar un programa de actividad fsica para verificar que sea seguro para usted. Recuerde, cualquier actividad es mejor que ninguna.  Document Released: 07/10/2007 Document Revised: 09/12/2011 [facility] Patient Information 2013 [place], [place].  Gua de planeamiento  de la alimentacin para diabticos (Diabetes Meal Planning Guide) La gua de planeamiento de alimentacin para diabticos es una herramienta para ayudarlo a planear sus comidas y colaciones. Es importante para las personas  con diabetes controlar sus niveles de International aid/development worker. Elegir los [employer] correctos y las cantidades adecuadas durante el da le ayudar a Technical brewer. [place] bien puede incluso ayudarlo a mejorar la presin sangunea y Barista o Pharmacologist un peso saludable. CUENTE LOS HIDRATOS DE CARBONO CON FACILIDAD Cuando consume hidratos de carbono, stos se transforman en azcar (glucosa). Esto a su vez Counsellor de Production assistant, radio. El conteo de carbohidratos puede ayudarlo a Chief Operating Officer este nivel para que se sienta mejor. Al planear sus alimentos con el conteo de carbohidratos, podr tener ms flexibilidad en lo que come y Physiological scientist con el consumo de alimentos. El conteo de carbohidratos significa simplemente sumar la cantidad total de gramos de carbohidratos a sus comidas o colaciones. Trate de consumir la misma cantidad en cada comida. A continuacin encontrar una lista de 1 porcin o 15 gr. de carbohidratos. A continuacin se enumeran. Pregunte al mdico cuntos gramos de carbohidratos necesita comer en cada comida o colacin. Almidones y granos  1 [place] de pan.   bollo ingls o bollo para hamburguesa o hotdog.   taza de cereal fro (sin azcar).   taza de pasta o arroz cocido.   taza de vegetales que contengan almidn (maz, papas, arvejas, porotos, calabaza).  1 omelette (6 pulgadas).   bollo.  1 waffle o panqueque (del tamao de un CD).   taza de cereales cocidos.  4 a 6 galletas saldas pequeas. *Se recomienda el consumo de granos enteros. Frutas  1 taza de frutos rojos, meln, papaya o anan sin azcar.  1 fruta fresca pequea.   banana o mango.   taza de jugo de frutas (4 onzas sin endulzar).   taza de fruta envasada en jugo natural o agua.  2 cucharadas de frutas secas.  12-15 uvas o cerezas. Leche y yogurt  1 taza de [employer] o al 1%.  1 taza de leche de soja.  6 onzas de yogurt descremado con edulcorante sin  azcar.  6 onzas de yogur descremado de soja.  6 onzas de yogur natural. Vegetales  1 taza de vegetales crudos o  de vegetales cocidos se considera cero carbohidratos o una comida "libre".  Si come 3 o ms porciones en una comida, cuntelas como 1 porcin de carbohidratos. Otros carbohidratos   onzas de chips o pretzels.   taza de helado de crema o yogur helado.   taza de helado de agua.  5 cm de torta no congelada.  1 cucharada de miel, azcar, mermelada, jalea o almbar.  2 galletitas dulces pequeas.  3 cuadrados de crackers de graham.  3 tazas de palomitas de maz.  6 crackers.  1 taza de caldo.  Cuente 1 taza de guisado u otra mezcla de alimentos como 2 porciones de carbohidratos.  Los alimentos con menos de 20 caloras por porcin deben contarse como cero carbohidratos o alimento "libre". Si lo desea compre un libro o software de computacin que enumere la cantidad de gramos de carbohidratos de los diferentes alimentos. Adems, el panel nutricional en las etiquetas de los productos que consume es una buena fuente de informacin. Le indicar el tamao de la porcin y la cantidad total de carbohidratos que consumir por cada una. Divida este nmero por 15 para obtener el nmero de conteo  de carbohidratos por porcin. Recuerde: cada porcin son 13 gramos de carbohidratos. PORCIONES La medicin de los alimentos y el tamao de las porciones lo ayudarn a Scientist, physiological cantidad exacta de comida que debe ingerir. La lista que sigue le mostrar el tamao de algunas porciones comunes.   1 onza.................4 dados apilados.  3 onzas..............Marland KitchenUn mazo de cartas.  1 cucharadita...Marland KitchenMarland KitchenLa punta de un dedo pequeo.  1 cucharada.......Marland KitchenUn dedo.  2 cucharadas....Marland KitchenMarland KitchenUna pelota de golf.   taza..............Marland KitchenLa mitad de un puo.  1 taza...............Marland KitchenUn puo. EJEMPLO DE PLAN DE ALIMENTACIN PARA DIABTICOS: A continuacin se muestra un ejemplo de plan de alimentacin  que incluye comidas de los grupos de granos y [place], Sports administrator, frutas y carnes. Un nutricionista podr confeccionarle un plan individualizado para cubrir sus necesidades calricas y decirle el nmero de porciones que necesita de [place]. Sin embargo, podra [employer] alimentos que contengan carbohidratos (lcteos, cereales y frutas). Controlar la cantidad total de carbohidratos en los alimentos o colaciones es ms importante que asegurarse de incluir todos los grupos alimenticios cada vez que come.  El siguiente plan de alimentacin es un ejemplo de una dieta de 2000 caloras mediante el conteo de carbohidratos. Este plan contiene 17 porciones de carbohidratos. Desayuno  1 taza de avena (2 porciones de carbohidratos).   taza de yogur light(1 porcin de carbohidratos).  1 taza de arndanos (1 porcin de carbohidratos).   taza de almendras. Colacin  1 manzana grande (2 porciones de carbohidratos).  1 palito de queso bajo [employer]. Almuerzo  Ensalada de pechuga de pollo.  1 taza de espinacas.   taza de tomates cortados.  2 oz (60 gr) de pechuga de pollo en rebanadas.  2 cucharadas de aderezo italiano bajo en [employer].  12 galletas integrales (2 porciones de carbohidratos).  12 a 15 uvas (1 porcin de carbohidratos).  1 taza de [employer] (1porcin de carbohidratos). Colacin  1 taza de zanahorias.   taza de pur de garbanzos (1 porcin de carbohidratos). Cena  3 oz (80 gr) de salmn a la parrilla.  1 taza de arroz integral (3 porciones de carbohidratos). Colacin  1  taza de brcoli al vapor (1 porcin de carbohidrato) con una cucharadita de aceite de oliva y jugo de limn.  1 taza de budn light (2 porciones de carbohidratos). HOJA DE PLANEAMIENTO DE LA ALIMENTACIN: El dietista podr utilizar esta hoja para ayudarlo a decidir cuntas porciones y qu tipos de alimentos son los adecuados para usted.  DESAYUNO Grupo de alimentos y  porciones / Alimento elegido Granos/Fculas_________________________________________________ Lcteos________________________________________________________ Rufina Falco ______________________________________________________ Lou Miner __________________________________________________________ Charlesetta Ivory _________________________________________________________ Rosalin Hawking _________________________________________________________ Lorin Mercy de alimentos y porciones / Alimento elegido Granos/Fculas___________________________________________________ Lcteos_________________________________________________________ Lou Miner ___________________________________________________________ Charlesetta Ivory __________________________________________________________ Rosalin Hawking __________________________________________________________ Danford Bad de alimentos y porciones / Alimento elegido Granos/Fculas___________________________________________________ Lcteos_________________________________________________________ Lou Miner ___________________________________________________________ Charlesetta Ivory __________________________________________________________ Rosalin Hawking __________________________________________________________ Jettie Pagan de alimentos y porciones / Alimento elegido Granos/Fculas_________________________________________________ Lcteos________________________________________________________ Rufina Falco ______________________________________________________ Lou Miner _________________________________________________________ Charlesetta Ivory ________________________________________________________ Rosalin Hawking ________________________________________________________ Carolyn Stare DIARIOS Fculas_______________________________________________________ Vegetales _____________________________________________________ Lou Miner  ________________________________________________________ Lcteos_______________________________________________________ Carnes________________________________________________________ Rosalin Hawking ________________________________________________________ Document Released: 09/27/2007 Document Revised: 09/12/2011 ExitCare Patient Information 2013 [place] [place].

## 2012-08-08 NOTE — Assessment & Plan Note (Signed)
Good decreased from 13.1 to 11.0 of A1C in less than a month with her regular oral therapy, but still uncontrolled. Declines start on insulin at this time.  Plan: I think is reasonable to continue taking oral therapy and reevaluate in 2 months. If A1c still on 2 digits she will consider start on insulin therapy. Positive reinforcement about diabetes diet and exercise.

## 2012-08-08 NOTE — Progress Notes (Signed)
Family Medicine Office Visit Note   Subjective:   Patient ID: [name], female  DOB: [birth year], 37 y.o.. MRN: [id number]   Pt that comes today to f/u her DM. She had an A1C in Jan of 13.1 after months of being non compliant with her meds (she was traveling taking care of her ill sister in [place]) She restarted her meds and less than a month later her A1C is 11.0 today. She reports no complaints and is also trying to make healthy diatary choices. No significant weight loss reported. She denies exercising but interested in doing so when weather gets better.  Pt also with Hypothyroidism last checked was March last year (TSH was elevated) Levothyroxine was increased since then but she has not being complaint either for the past months.  Review of Systems:  Pt denies SOB, chest pain, palpitations, headaches, dizziness, numbness or weakness. No changes on urinary or BM habits.   Objective:   Physical Exam: Gen:  NAD HEENT: Moist mucous membranes  CV: Regular rate and rhythm, no murmurs rubs or gallops PULM: Clear to auscultation bilaterally. No wheezes/rales/rhonchi ABD: Soft, non tender, non distended, normal bowel sounds EXT: No edema Neuro: Alert and oriented x3. No focalization  Assessment & Plan:

## 2012-08-08 NOTE — Assessment & Plan Note (Signed)
Has not being compliant. No point on checking TSH now. Plan: Will check TSH before her next appointment and adjust therapy.  Order placed as future.

## 2012-09-19 ENCOUNTER — Encounter: Payer: Self-pay | Admitting: *Deleted

## 2012-10-01 ENCOUNTER — Telehealth: Payer: Self-pay | Admitting: *Deleted

## 2012-10-01 NOTE — Telephone Encounter (Signed)
[name] at [facility] left message on Pharmacy line stating they have a new manufacturer for Levothyroxine and needs authorization to use Levothyroxine from the new manufacturer.  Precepted with Dr. Mahala Menghini.  OK to use new manufacturer.  [facility] notified.  [name]

## 2012-11-06 ENCOUNTER — Encounter: Payer: Self-pay | Admitting: Family Medicine

## 2012-11-06 ENCOUNTER — Ambulatory Visit (INDEPENDENT_AMBULATORY_CARE_PROVIDER_SITE_OTHER): Payer: No Typology Code available for payment source | Admitting: Family Medicine

## 2012-11-06 VITALS — BP 103/60 | HR 73 | Temp 99.8°F | Ht 62.0 in | Wt 189.0 lb

## 2012-11-06 DIAGNOSIS — E039 Hypothyroidism, unspecified: Secondary | ICD-10-CM

## 2012-11-06 DIAGNOSIS — E1165 Type 2 diabetes mellitus with hyperglycemia: Secondary | ICD-10-CM

## 2012-11-06 DIAGNOSIS — F4323 Adjustment disorder with mixed anxiety and depressed mood: Secondary | ICD-10-CM

## 2012-11-06 DIAGNOSIS — F329 Major depressive disorder, single episode, unspecified: Secondary | ICD-10-CM

## 2012-11-06 DIAGNOSIS — E119 Type 2 diabetes mellitus without complications: Secondary | ICD-10-CM

## 2012-11-06 LAB — CBC
HCT: 41.9 % (ref 36.0–46.0)
MCHC: 33.4 g/dL (ref 30.0–36.0)
MCV: 87.1 fL (ref 78.0–100.0)
Platelets: 195 10*3/uL (ref 150–400)
RDW: 14.4 % (ref 11.5–15.5)

## 2012-11-06 LAB — COMPREHENSIVE METABOLIC PANEL
ALT: 21 U/L (ref 0–35)
AST: 15 U/L (ref 0–37)
Alkaline Phosphatase: 77 U/L (ref 39–117)
BUN: 9 mg/dL (ref 6–23)
Creat: 0.69 mg/dL (ref 0.50–1.10)
Potassium: 4.3 mEq/L (ref 3.5–5.3)

## 2012-11-06 LAB — POCT UA - GLUCOSE/PROTEIN: Protein, UA: NEGATIVE

## 2012-11-06 LAB — POCT GLYCOSYLATED HEMOGLOBIN (HGB A1C): Hemoglobin A1C: 8.4

## 2012-11-06 MED ORDER — PAROXETINE HCL 10 MG PO TABS: 10.0000 mg | ORAL_TABLET | ORAL | Status: DC

## 2012-11-06 MED ORDER — LEVOTHYROXINE SODIUM 150 MCG PO TABS: 150.0000 ug | ORAL_TABLET | Freq: Every day | ORAL | Status: DC

## 2012-11-06 NOTE — Assessment & Plan Note (Signed)
A1C now at 8.4 from 11.0 three months ago. P/ Continue current regimen. Education to pt about the importance of small frequent meals and avoidance of long period of fasting with her DM.

## 2012-11-06 NOTE — Patient Instructions (Addendum)
Recursos para Surveyor, quantity [facility] [place]. telefono [phone]  Comienza a tomar la medicina recetada. Paxil 10 mg una vez al dia. Haz una cita de seguimiento en 2 semanas.

## 2012-11-06 NOTE — Assessment & Plan Note (Addendum)
Pt did not have blood drawn before her appointment.  P/ Labs obtained today. TSH 0.023 (low) Will decreased Levothyroxine from 175 to 150 mcg daily.
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
## 2012-11-06 NOTE — Assessment & Plan Note (Signed)
Discussed with pt in depth. Pt would like to start pharmacologic therapy to help with her symptoms. No suicidal ideation or plans. P/ Resources for psychotherapy personal and as couple offered ([facility] [place])   Paxil 10 mg daily and close follow up.

## 2012-11-07 ENCOUNTER — Telehealth: Payer: Self-pay | Admitting: Family Medicine

## 2012-11-07 NOTE — Telephone Encounter (Signed)
Pt call us back and I was explaining lab's results for tsh. Pt will go to pharmacy and get new Rx.  Marines

## 2012-11-21 ENCOUNTER — Ambulatory Visit (INDEPENDENT_AMBULATORY_CARE_PROVIDER_SITE_OTHER): Payer: No Typology Code available for payment source | Admitting: Family Medicine

## 2012-11-21 ENCOUNTER — Encounter: Payer: Self-pay | Admitting: Family Medicine

## 2012-11-21 VITALS — BP 112/70 | HR 64 | Ht 61.0 in | Wt 189.0 lb

## 2012-11-21 DIAGNOSIS — E039 Hypothyroidism, unspecified: Secondary | ICD-10-CM

## 2012-11-21 NOTE — Progress Notes (Signed)
Pt left before being seen

## 2012-12-05 ENCOUNTER — Ambulatory Visit (INDEPENDENT_AMBULATORY_CARE_PROVIDER_SITE_OTHER): Payer: No Typology Code available for payment source | Admitting: Family Medicine

## 2012-12-05 ENCOUNTER — Encounter: Payer: Self-pay | Admitting: Family Medicine

## 2012-12-05 VITALS — BP 109/60 | HR 73 | Temp 98.9°F | Ht 64.5 in | Wt 185.0 lb

## 2012-12-05 DIAGNOSIS — F4323 Adjustment disorder with mixed anxiety and depressed mood: Secondary | ICD-10-CM

## 2012-12-05 DIAGNOSIS — R300 Dysuria: Secondary | ICD-10-CM

## 2012-12-05 DIAGNOSIS — N390 Urinary tract infection, site not specified: Secondary | ICD-10-CM

## 2012-12-05 LAB — POCT UA - MICROSCOPIC ONLY

## 2012-12-05 LAB — POCT URINALYSIS DIPSTICK
Glucose, UA: 250
Ketones, UA: NEGATIVE
Spec Grav, UA: <=1.005

## 2012-12-05 MED ORDER — CEPHALEXIN 500 MG PO CAPS: 500.0000 mg | ORAL_CAPSULE | Freq: Three times a day (TID) | ORAL | Status: DC
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
## 2012-12-05 NOTE — Assessment & Plan Note (Signed)
Frequency and dysuria. UA positive for leucocytes, RBC and bacteria. No fever flank pain or CVA tenderness. P/ Uncomplicated UTI. Keflex for 7 days Urine sent for culture.
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
## 2012-12-05 NOTE — Patient Instructions (Addendum)
  Infeccin urinaria  (Urinary Tract Infection)  La infeccin urinaria puede ocurrir en cualquier lugar del tracto urinario. El tracto urinario es un sistema de drenaje del cuerpo por el que se eliminan los desechos y el exceso de agua. El tracto urinario est formado por dos riones, dos urteres, la vejiga y la uretra. Los riones son rganos que tienen forma de frijol. Cada rin tiene aproximadamente el tamao del puo. Estn situados debajo de las costillas, uno a cada lado de la columna vertebral CAUSAS  La causa de la infeccin son los microbios, que son organismos microscpicos, que incluyen hongos, virus, y bacterias. Estos organismos son tan pequeos que slo pueden verse a travs del microscopio. Las bacterias son los microorganismos que ms comnmente causan infecciones urinarias.  SNTOMAS  Los sntomas pueden variar segn la edad y el sexo del paciente y por la ubicacin de la infeccin. Los sntomas en las mujeres jvenes incluyen la necesidad frecuente e intensa de orinar y una sensacin dolorosa de ardor en la vejiga o en la uretra durante la miccin. Las mujeres y los hombres mayores podrn sentir cansancio, temblores y debilidad y sentir dolores musculares y dolor abdominal. Si tiene fiebre, puede significar que la infeccin est en los riones. Otros sntomas son dolor en la espalda o en los lados debajo de las costillas, nuseas y vmitos.  DIAGNSTICO  Para diagnosticar una infeccin urinaria, el mdico le preguntar acerca de sus sntomas. Tambin le solicitar una muestra de orina. La muestra de orina se analiza para detectar bacterias y glbulos blancos de la sangre. Los glbulos blancos se forman en el organismo para ayudar a combatir las infecciones.  TRATAMIENTO  Por lo general, las infecciones urinarias pueden tratarse con medicamentos. Debido a que la mayora de las infecciones son causadas por bacterias, por lo general pueden tratarse con antibiticos. La eleccin del  antibitico y la duracin del tratamiento depender de sus sntomas y el tipo de bacteria causante de la infeccin.  INSTRUCCIONES PARA EL CUIDADO EN EL HOGAR   Si le recetaron antibiticos, tmelos exactamente como su mdico le indique. Termine el medicamento aunque se sienta mejor despus de haber tomado slo algunos.  Beba gran cantidad de lquido para mantener la orina de tono claro o color amarillo plido.  Evite la cafena, el t y las bebidas gaseosas. Estas sustancias irritan la vejiga.  Vaciar la vejiga con frecuencia. Evite retener la orina durante largos perodos.  Vace la vejiga antes y despus de tener relaciones sexuales.  Despus de mover el intestino, las mujeres deben higienizarse la regin perineal desde adelante hacia atrs. Use slo un papel tissue por vez. SOLICITE ATENCIN MDICA SI:   Siente dolor en la espalda.  Le sube la fiebre.  Los sntomas no mejoran luego de 3 das. SOLICITE ATENCIN MDICA DE INMEDIATO SI:   Siente dolor intenso en la espalda o en la zona inferior del abdomen.  Comienza a sentir escalofros.  Tiene nuseas o vmitos.  Tiene una sensacin continua de quemazn o molestias al orinar. ASEGRESE DE QUE:   Comprende estas instrucciones.  Controlar su enfermedad.  Solicitar ayuda de inmediato si no mejora o empeora. Document Released: 03/30/2005 Document Revised: 03/14/2012 ExitCare Patient Information 2014 [employer].  

## 2012-12-07 LAB — URINE CULTURE: Colony Count: >=100000

## 2013-03-01 ENCOUNTER — Ambulatory Visit: Payer: Self-pay

## 2013-03-08 ENCOUNTER — Ambulatory Visit: Payer: No Typology Code available for payment source

## 2013-05-17 ENCOUNTER — Ambulatory Visit (INDEPENDENT_AMBULATORY_CARE_PROVIDER_SITE_OTHER): Payer: No Typology Code available for payment source | Admitting: Family Medicine

## 2013-05-17 ENCOUNTER — Encounter: Payer: Self-pay | Admitting: Family Medicine

## 2013-05-17 VITALS — BP 111/67 | HR 82 | Temp 98.9°F | Ht 64.5 in | Wt 186.0 lb

## 2013-05-17 DIAGNOSIS — H5789 Other specified disorders of eye and adnexa: Secondary | ICD-10-CM | POA: Insufficient documentation

## 2013-05-17 NOTE — Patient Instructions (Signed)
Yo te recomiendo evaluacion por Audiological scientist. Puedes usar lagrimas artificiales para Pharmacologist tu ojo lubricado.

## 2013-05-17 NOTE — Progress Notes (Signed)
Family Medicine Office Visit Note   Subjective:   Patient ID: [name], female  DOB: 1976/01/31, 37 y.o.. MRN: [id number]   Pt that comes today complaining of left eye redness. She reports is using contacts for about 2 years and yesterday she noticed her left eye contact was missing a small piece on the edge. Nevertheless she put it on and when taking it out she started feeling foreign body sensation on the left eye. Her husband checked on her eye but could not find anything abnormal. She went to sleep and this morning woke up with yellowish eye crusted secretions closing her eyelashes. After rinsing she was able to open her eye and noticed it was red and had "sand like" sensation when blinking. Denies pain, change in vision or other symptoms.   Review of Systems:  Per HPI  Objective:   Physical Exam: Gen:  NAD Left eye with conjunctival erythema. Fluorescing test was negative for corneal abrasion. Vision acuity corrected with glasses was 20/20. Normal size pupils and normal light and accommodation reflex.  Eye movements were normal  Right eye normal.  Assessment & Plan:

## 2013-05-17 NOTE — Assessment & Plan Note (Signed)
We were not able to exam pt with sit lamp. Up to what we were able to see with limited magnification there were no foreign body. Rest of examination was not remarkable. Vision acuity is not affected. Discussed with pt and recommended ophthalmology evaluation.

## 2013-05-20 ENCOUNTER — Encounter: Payer: Self-pay | Admitting: Family Medicine

## 2013-05-20 ENCOUNTER — Ambulatory Visit (INDEPENDENT_AMBULATORY_CARE_PROVIDER_SITE_OTHER): Payer: No Typology Code available for payment source | Admitting: Family Medicine

## 2013-05-20 VITALS — BP 112/61 | HR 79 | Temp 99.7°F | Ht 62.5 in | Wt 191.0 lb

## 2013-05-20 DIAGNOSIS — H5789 Other specified disorders of eye and adnexa: Secondary | ICD-10-CM

## 2013-05-20 DIAGNOSIS — E1165 Type 2 diabetes mellitus with hyperglycemia: Secondary | ICD-10-CM

## 2013-05-20 DIAGNOSIS — E785 Hyperlipidemia, unspecified: Secondary | ICD-10-CM

## 2013-05-20 DIAGNOSIS — E039 Hypothyroidism, unspecified: Secondary | ICD-10-CM

## 2013-05-20 DIAGNOSIS — F4323 Adjustment disorder with mixed anxiety and depressed mood: Secondary | ICD-10-CM

## 2013-05-20 MED ORDER — FLUCONAZOLE 150 MG PO TABS: 150.0000 mg | ORAL_TABLET | Freq: Once | ORAL | Status: DC

## 2013-05-20 NOTE — Patient Instructions (Addendum)
Please make an appointment for labs. I will call you with the results if they come back abnormal and changes in your regimen needed to be done.  You also will receive a letter with them.  F/u appointment with me in 3-4 months.

## 2013-05-20 NOTE — Progress Notes (Signed)
Family Medicine Office Visit Note   Subjective:   Patient ID: [name], female  DOB: 12/04/1975, 37 y.o.. MRN: [id number]   Pt that comes today to f/u her chronic conditions.  DM: pt is taking Metformin and Glipizide as prescribed but her diet has not been controlled. Pt tells me "I know my A1C will be elevated". Denies hypoglycemic events or hyperglycemic symptoms.   Hypothyroidism: compliant  with medication. No side effect noted.  HLD and HTN. Last labs were a year ago, she was taking Pravachol and Lisinopril but reports not taking this any longer since Lisinopril was giving her cough and she was not interested in taking any more medications for her cholesterol.   Review of Systems:  Pt denies SOB, chest pain, palpitations, headaches, dizziness, numbness or weakness. No changes on urinary or BM habits. No unintentional weigh loss/gain.  Objective:   Physical Exam: Gen:  Obese, NAD HEENT: Moist mucous membranes  CV: Regular rate and rhythm, no murmurs rubs or gallops PULM: Clear to auscultation bilaterally. No wheezes/rales/rhonchi ABD: Soft, non tender, non distended, normal bowel sounds EXT: No edema Neuro: Alert and oriented x3. No focalization  Assessment & Plan:

## 2013-05-21 ENCOUNTER — Other Ambulatory Visit (INDEPENDENT_AMBULATORY_CARE_PROVIDER_SITE_OTHER): Payer: No Typology Code available for payment source

## 2013-05-21 DIAGNOSIS — E1165 Type 2 diabetes mellitus with hyperglycemia: Secondary | ICD-10-CM

## 2013-05-21 LAB — CBC
MCH: 30.4 pg (ref 26.0–34.0)
Platelets: 209 10*3/uL (ref 150–400)
RBC: 4.93 MIL/uL (ref 3.87–5.11)
WBC: 7.9 10*3/uL (ref 4.0–10.5)

## 2013-05-21 LAB — BASIC METABOLIC PANEL
BUN: 11 mg/dL (ref 6–23)
Chloride: 102 mEq/L (ref 96–112)
Creat: 0.63 mg/dL (ref 0.50–1.10)

## 2013-05-21 LAB — LIPID PANEL
HDL: 50 mg/dL (ref >39)
LDL Cholesterol: 159 mg/dL — ABNORMAL HIGH (ref 0–99)
Total CHOL/HDL Ratio: 4.9 Ratio
Triglycerides: 175 mg/dL — ABNORMAL HIGH (ref <150)
VLDL: 35 mg/dL (ref 0–40)

## 2013-05-21 NOTE — Assessment & Plan Note (Signed)
Resolved completely

## 2013-05-21 NOTE — Assessment & Plan Note (Signed)
Lipid profile ordered. With her past Lipid profile pt had 39% 10-year ASCVD risk. She needs to be on statin. Pt was on Pravachol but not taking it anymore. This was discussed and pt refused medication. Agreeable  to re-check lipid profile and hopefully this will be other element she can have to decide start medication.

## 2013-05-21 NOTE — Assessment & Plan Note (Signed)
Reports her relationship with her husband has improved and she is happier. Declines more detail assessment.

## 2013-05-21 NOTE — Progress Notes (Signed)
BMP,CBC,FLP AND TSH DONE TODAY [name]

## 2013-05-21 NOTE — Assessment & Plan Note (Signed)
TSH has been elevated and pt was not compliant. Now reports compliace will repeat TSH.

## 2013-05-21 NOTE — Assessment & Plan Note (Signed)
A1C 8.5 mildly increase from prior 8.4 but better than 11.0 in Feb/2014. Discussed with pt goal of A1C below 7. She is aware of complication of DM with no tight sugar control but refuses changes on her current treatment. No on Lisinopril due to cough. Urine proteinuria is negative today.

## 2013-05-23 ENCOUNTER — Telehealth: Payer: Self-pay | Admitting: Family Medicine

## 2013-05-23 ENCOUNTER — Encounter: Payer: Self-pay | Admitting: Family Medicine

## 2013-05-23 DIAGNOSIS — E785 Hyperlipidemia, unspecified: Secondary | ICD-10-CM

## 2013-05-23 MED ORDER — ATORVASTATIN CALCIUM 80 MG PO TABS: 80.0000 mg | ORAL_TABLET | Freq: Every day | ORAL | Status: DC

## 2013-05-23 NOTE — Telephone Encounter (Signed)
Tried unsuccessfully to call pt to inform labs. Letter sent. Lipitor needed to control cholesterol. Prescription sent to pharmacy. TSH also elevated, pt needs to adjust dose or evaluate for truly compliance.

## 2013-05-23 NOTE — Assessment & Plan Note (Signed)
Lipitor 80mg  sent to pharmacy and letter to pt sent explaining labs results. Unable to reach pt through phone.

## 2013-05-28 ENCOUNTER — Ambulatory Visit: Payer: No Typology Code available for payment source

## 2013-07-18 ENCOUNTER — Ambulatory Visit: Payer: No Typology Code available for payment source

## 2013-11-18 ENCOUNTER — Other Ambulatory Visit: Payer: Self-pay | Admitting: *Deleted

## 2013-11-18 DIAGNOSIS — E039 Hypothyroidism, unspecified: Secondary | ICD-10-CM

## 2013-11-20 MED ORDER — LEVOTHYROXINE SODIUM 150 MCG PO TABS: 150.0000 ug | ORAL_TABLET | Freq: Every day | ORAL | Status: DC

## 2014-03-03 ENCOUNTER — Ambulatory Visit: Payer: Self-pay

## 2014-08-20 ENCOUNTER — Ambulatory Visit: Payer: Self-pay

## 2014-08-21 ENCOUNTER — Ambulatory Visit (INDEPENDENT_AMBULATORY_CARE_PROVIDER_SITE_OTHER): Payer: Self-pay | Admitting: Family Medicine

## 2014-08-21 ENCOUNTER — Encounter: Payer: Self-pay | Admitting: Family Medicine

## 2014-08-21 VITALS — BP 111/72 | HR 71 | Temp 98.4°F | Ht 64.5 in | Wt 198.5 lb

## 2014-08-21 DIAGNOSIS — IMO0002 Reserved for concepts with insufficient information to code with codable children: Secondary | ICD-10-CM

## 2014-08-21 DIAGNOSIS — E785 Hyperlipidemia, unspecified: Secondary | ICD-10-CM

## 2014-08-21 DIAGNOSIS — E1165 Type 2 diabetes mellitus with hyperglycemia: Secondary | ICD-10-CM

## 2014-08-21 DIAGNOSIS — E039 Hypothyroidism, unspecified: Secondary | ICD-10-CM

## 2014-08-21 DIAGNOSIS — Z23 Encounter for immunization: Secondary | ICD-10-CM

## 2014-08-21 LAB — POCT GLYCOSYLATED HEMOGLOBIN (HGB A1C): Hemoglobin A1C: 11.6

## 2014-08-21 MED ORDER — LEVOTHYROXINE SODIUM 150 MCG PO TABS: 150.0000 ug | ORAL_TABLET | Freq: Every day | ORAL | Status: DC

## 2014-08-21 MED ORDER — GLIPIZIDE 10 MG PO TABS: 10.0000 mg | ORAL_TABLET | Freq: Two times a day (BID) | ORAL | Status: DC

## 2014-08-21 MED ORDER — METFORMIN HCL 1000 MG PO TABS: 1000.0000 mg | ORAL_TABLET | Freq: Two times a day (BID) | ORAL | Status: DC

## 2014-08-21 MED ORDER — ATORVASTATIN CALCIUM 80 MG PO TABS: 80.0000 mg | ORAL_TABLET | Freq: Every day | ORAL | Status: DC

## 2014-08-21 NOTE — Assessment & Plan Note (Signed)
Lipid panel today. Statin refilled.

## 2014-08-21 NOTE — Addendum Note (Signed)
Addended by: Lamonte SakaiZIMMERMAN RUMPLE, APRIL D on: 08/21/2014 04:41 PM   Modules accepted: Orders

## 2014-08-21 NOTE — Assessment & Plan Note (Addendum)
Obtaining A1C today. Also obtaining CMP and Urine microalbumin/creatinine ratio to assess for proteinuria.  Refilled Glipizide and Metformin while awaiting A1C. Encourage compliance with medications.

## 2014-08-21 NOTE — Patient Instructions (Signed)
Fue agradable ver que en la actualidad.  Vamos a [employer] adjusterestar en contacto con respecto a sus resultados de laboratorio.  Por favor, obtener sus medicamentos y [employer] todos los das.  Seguimiento en ~ 1-3 meses.

## 2014-08-21 NOTE — Assessment & Plan Note (Signed)
TSH today. Refilled Synthroid.

## 2014-08-21 NOTE — Progress Notes (Addendum)
   Subjective:    Patient ID: [name], female    DOB: 1976-03-17, 39 y.o.   MRN: [id number]  HPI 39 year old female with uncontrolled DM 2, hypothyroidism, and hyperlipidemia presents for follow-up.  1) DM-2  CBG's - Not checking.  Medications - patient takes metformin 1000 mg twice a day, glipizide 10 mg twice a day.  Patient has not been taking her medication regularly over the past year. She is currently out of her medication.  Compliance - No (see above).  Preventative Healthcare  In need of - Foot exam, Eye exam, Tdap, Flu vaccine, Pneumovax.   2) Hypothyroidism  Patient is out of her Synthroid. She has also not been taking this regularly.  3) HLD  Out of Atorvastatin.  PMH reviewed.   Past Medical History  Diagnosis Date  . Diabetes mellitus   . Thyroid disease   . Hyperlipidemia    Review of Systems Per HPI    Objective:   Physical Exam Filed Vitals:   08/21/14 1419  BP: 111/72  Pulse: 71  Temp: 98.4 F (36.9 C)   Exam: General: well appearing, NAD. Cardiovascular: RRR. No murmurs, rubs, or gallops. Respiratory: CTAB. No rales, rhonchi, or wheeze. Abdomen: soft, nontender, nondistended.  Diabetic Foot Check -  Appearance - no lesions, ulcers or calluses Skin - no unusual pallor or redness Monofilament testing -  Right - Great toe, medial, central, lateral ball and posterior foot intact Left - Great toe, medial, central, lateral ball and posterior foot intact     Assessment & Plan:  See Problem List

## 2014-08-22 ENCOUNTER — Telehealth: Payer: Self-pay | Admitting: *Deleted

## 2014-08-22 LAB — LIPID PANEL
Cholesterol: 276 mg/dL — ABNORMAL HIGH (ref 0–200)
HDL: 47 mg/dL (ref >39)
TRIGLYCERIDES: 524 mg/dL — AB (ref <150)
Total CHOL/HDL Ratio: 5.9 Ratio

## 2014-08-22 LAB — COMPLETE METABOLIC PANEL WITH GFR
ALBUMIN: 4 g/dL (ref 3.5–5.2)
ALK PHOS: 118 U/L — AB (ref 39–117)
ALT: 29 U/L (ref 0–35)
AST: 18 U/L (ref 0–37)
BUN: 11 mg/dL (ref 6–23)
CALCIUM: 9.1 mg/dL (ref 8.4–10.5)
CHLORIDE: 97 meq/L (ref 96–112)
CO2: 29 mEq/L (ref 19–32)
Creat: 0.65 mg/dL (ref 0.50–1.10)
GFR, Est African American: >89 mL/min
GFR, Est Non African American: >89 mL/min
Glucose, Bld: 380 mg/dL — ABNORMAL HIGH (ref 70–99)
POTASSIUM: 4.2 meq/L (ref 3.5–5.3)
SODIUM: 133 meq/L — AB (ref 135–145)
TOTAL PROTEIN: 6.7 g/dL (ref 6.0–8.3)
Total Bilirubin: 0.6 mg/dL (ref 0.2–1.2)

## 2014-08-22 LAB — TSH: TSH: 36.244 u[IU]/mL — ABNORMAL HIGH (ref 0.350–4.500)

## 2014-08-22 LAB — MICROALBUMIN / CREATININE URINE RATIO
CREATININE, URINE: 50.4 mg/dL
MICROALB UR: 0.4 mg/dL (ref <2.0)
MICROALB/CREAT RATIO: 7.9 mg/g (ref 0.0–30.0)

## 2014-08-22 NOTE — Telephone Encounter (Signed)
-----   Message from Tommie SamsJayce G Cook, DO sent at 08/22/2014  8:37 AM EST ----- Please have the patient make an appointment to go over her lab results and to discuss treatment options regarding her diabetes.  Thanks  Everlene OtherJayce Cook DO Family Medicine PGY-3 Pager #: [phone]

## 2014-08-22 NOTE — Telephone Encounter (Signed)
Attempted to contact pt to inform her of below but her phone was not accepting voice mail due to it not being set up. Lamonte SakaiZimmerman Rumple, Kiah Keay D

## 2014-09-01 NOTE — Telephone Encounter (Signed)
LVM for pt to return call to let her know that she needs to make an appointment to go over her lab results and to discuss treatment options regarding her diabetes per Dr. Adriana Simasook. [name], Max Romano D

## 2014-10-02 ENCOUNTER — Encounter: Payer: Self-pay | Admitting: *Deleted

## 2014-10-02 NOTE — Telephone Encounter (Signed)
Mailed letter requesting pt to schedule follow up appt. Lamonte SakaiZimmerman Rumple, Tanishka Drolet D

## 2014-10-31 ENCOUNTER — Encounter: Payer: Self-pay | Admitting: Family Medicine

## 2014-10-31 ENCOUNTER — Ambulatory Visit (INDEPENDENT_AMBULATORY_CARE_PROVIDER_SITE_OTHER): Payer: Self-pay | Admitting: Family Medicine

## 2014-10-31 VITALS — BP 125/79 | HR 89 | Temp 98.2°F | Ht 64.5 in | Wt 197.3 lb

## 2014-10-31 DIAGNOSIS — R300 Dysuria: Secondary | ICD-10-CM

## 2014-10-31 DIAGNOSIS — E1165 Type 2 diabetes mellitus with hyperglycemia: Secondary | ICD-10-CM

## 2014-10-31 DIAGNOSIS — J029 Acute pharyngitis, unspecified: Secondary | ICD-10-CM | POA: Insufficient documentation

## 2014-10-31 DIAGNOSIS — IMO0002 Reserved for concepts with insufficient information to code with codable children: Secondary | ICD-10-CM

## 2014-10-31 LAB — POCT URINALYSIS DIPSTICK
Bilirubin, UA: NEGATIVE
Blood, UA: NEGATIVE
Glucose, UA: 500
Ketones, UA: NEGATIVE
Leukocytes, UA: NEGATIVE
Nitrite, UA: NEGATIVE
Protein, UA: NEGATIVE
Spec Grav, UA: 1.01
Urobilinogen, UA: 1
pH, UA: 5.5

## 2014-10-31 MED ORDER — LORATADINE 10 MG PO TABS: 10.0000 mg | ORAL_TABLET | Freq: Every day | ORAL | Status: DC

## 2014-10-31 MED ORDER — BENZONATATE 100 MG PO CAPS: 100.0000 mg | ORAL_CAPSULE | Freq: Two times a day (BID) | ORAL | Status: DC | PRN

## 2014-10-31 MED ORDER — TERBINAFINE HCL 1 % EX CREA
1.0000 | TOPICAL_CREAM | Freq: Two times a day (BID) | CUTANEOUS | Status: DC
Start: 2014-10-31 — End: 2014-10-31

## 2014-10-31 MED ORDER — PHENOL 1.4 % MT LIQD: 1.0000 | OROMUCOSAL | Status: DC | PRN

## 2014-10-31 NOTE — Patient Instructions (Signed)
Fue agradable ver que en la actualidad.  Usar el spray segn sea necesario.  Seguimiento en ~ hace 1 mes (despus de 5/18).  Si usted no mejora, por favor hgamelo saber.  El Dr. Adriana Simasook,

## 2014-10-31 NOTE — Assessment & Plan Note (Signed)
Advised close follow up with me (Next month).

## 2014-10-31 NOTE — Progress Notes (Deleted)
Subjective:     Patient ID: [name], female   DOB: 1976/05/05, 39 y.o.   MRN: [id number]  HPI   Review of Systems     Objective:   Physical Exam     Assessment:     ***    Plan:     ***

## 2014-10-31 NOTE — Assessment & Plan Note (Signed)
Negative UA today (other than glucosuria). I suspect that the glucosuria is the source of her symptoms. Did not send for culture.

## 2014-10-31 NOTE — Assessment & Plan Note (Signed)
Sore throat of 1 days duration. Centor criteria = 0. No indication for testing. Likely viral in origin.

## 2014-10-31 NOTE — Progress Notes (Addendum)
   Subjective:    Patient ID: [name], female    DOB: 07-14-1975, 39 y.o.   MRN: [id number]  HPI 39 year old female with uncontrolled DM and HLD presents for a same day appointment with complaints of dysuria. Spanish interpreter used during visit.  1) Dysuria  Onset: ~ 5 days ago.   Worsening: No.    Symptoms:  Urgency: yes  Frequency: yes   Hesitancy: no   Hematuria: no   Flank Pain: no   Fever: no     Nausea/Vomiting: no   Pregnant: no  Discharge: no  Red Flags: (Risk Factors for Complicated UTI)  Recent Antibiotic Usage (last 30 days): no   Symptoms lasting more than seven (7) days: no    More than 3 UTI's last 12 months: no   PMH of    DM: Yes; Renal Disease/Calculi: no; Urinary Tract Abnormality; Inonstrumentation: no; Immunosuppression: no  2) Sore throat  Started yesterday.  Reports significant pain and discomfort with swallowing.  No known sick contacts.  She reports associated runny nose.  No fever, chills.   She has been taking advil with little relief.  No exacerbating factors.   Social Hx - Nonsmoker. PMH reviewed.   Review of Systems  Constitutional: Negative for fever.  HENT: Positive for rhinorrhea and sore throat.   Genitourinary: Positive for dysuria, urgency and frequency. Negative for vaginal discharge.      Objective:   Physical Exam Filed Vitals:   10/31/14 1552  BP: 125/79  Pulse: 89  Temp: 98.2 F (36.8 C)   Vital signs reviewed.  Exam: General: well appearing, NAD. HEENT: NCAT. Oropharynx with mild erythema. TM's normal bilaterally. Neck: Supple. No adenopathy. Cardiovascular: RRR. No murmurs, rubs, or gallops. Respiratory: CTAB. No rales, rhonchi, or wheeze.    Assessment & Plan:  See Problem List

## 2014-11-19 ENCOUNTER — Ambulatory Visit (INDEPENDENT_AMBULATORY_CARE_PROVIDER_SITE_OTHER): Payer: Self-pay | Admitting: Family Medicine

## 2014-11-19 ENCOUNTER — Encounter: Payer: Self-pay | Admitting: Family Medicine

## 2014-11-19 VITALS — BP 109/77 | HR 67 | Temp 98.5°F | Wt 194.2 lb

## 2014-11-19 DIAGNOSIS — E785 Hyperlipidemia, unspecified: Secondary | ICD-10-CM

## 2014-11-19 DIAGNOSIS — IMO0002 Reserved for concepts with insufficient information to code with codable children: Secondary | ICD-10-CM

## 2014-11-19 DIAGNOSIS — E1165 Type 2 diabetes mellitus with hyperglycemia: Secondary | ICD-10-CM

## 2014-11-19 DIAGNOSIS — E039 Hypothyroidism, unspecified: Secondary | ICD-10-CM

## 2014-11-19 DIAGNOSIS — Z0000 Encounter for general adult medical examination without abnormal findings: Secondary | ICD-10-CM

## 2014-11-19 LAB — POCT GLYCOSYLATED HEMOGLOBIN (HGB A1C): Hemoglobin A1C: 11.2

## 2014-11-19 LAB — TSH: TSH: 0.266 u[IU]/mL — ABNORMAL LOW (ref 0.350–4.500)

## 2014-11-19 MED ORDER — INSULIN PEN NEEDLE 31G X 5 MM MISC: Status: DC

## 2014-11-19 MED ORDER — INSULIN GLARGINE 100 UNIT/ML SOLOSTAR PEN: 15.0000 [IU] | PEN_INJECTOR | Freq: Every day | SUBCUTANEOUS | Status: DC

## 2014-11-19 NOTE — Addendum Note (Signed)
Addended by: Kathrin RuddyKOVAL, PETER G on: 11/19/2014 04:44 PM   Modules accepted: Orders

## 2014-11-19 NOTE — Assessment & Plan Note (Signed)
Referral to Ophthalmology and dentistry placed.

## 2014-11-19 NOTE — Assessment & Plan Note (Signed)
Checking TSH today. Will titrate therapy based on results.

## 2014-11-19 NOTE — Assessment & Plan Note (Signed)
After lengthy discussion about therapeutic options, patient and I agreed that starting insulin was best treatment course. Starting Lantus 15 units daily with instructions to titrate up one unit daily until fastings are below 100. Continuing Metformin and Glipizide at this time as well.  Follow up in 2 weeks.

## 2014-11-19 NOTE — Progress Notes (Signed)
Patient was assisted by [name] [phone]#[id number] of [employer] because of language barrier.Glennie HawkSimpson, Zuria Fosdick R

## 2014-11-19 NOTE — Assessment & Plan Note (Addendum)
Stable. Will continue high potency statin therapy w/ Lipitor.  Will plan to recheck later this year.

## 2014-11-19 NOTE — Progress Notes (Signed)
   Subjective:    Patient ID: [name], female    DOB: [birth year], 39 y.o.   MRN: [id number]  HPI 39 year old Hispanic female with a past medical history of DM 2, hypothyroidism, hyperlipidemia, obesity,  nonalcoholic fatty liver disease presents for follow-up.   1) DM-2  Remains uncontrolled.   Fasting CBG's - 210 - 272  Medications - patient currently taking metformin, and glipizide.  Compliance - Yes.   Medication side effects  Hypoglycemia: no   Preventative Healthcare up to date?  In need of - Diabetic eye exam.   2) Hypothyroidism  Patient has been recently experiencing some fatigue.  She endorses compliance with her Synthroid.  Last TSH was uncontrolled (Feb 2016).  3) HLD  Stable.   Patient compliant with statin therapy.   No reported side effects from treatment.   Social Hx - Nonsmoker.   Review of Systems  Constitutional: Positive for fatigue.  Respiratory: Negative.   Cardiovascular: Negative.   Musculoskeletal: Positive for back pain.      Objective:   Physical Exam Filed Vitals:   11/19/14 0830  BP: 109/77  Pulse: 67  Temp: 98.5 F (36.9 C)   Vital signs reviewed.  Exam: General: well appearing, NAD. Cardiovascular: RRR. No murmurs, rubs, or gallops. Respiratory: CTAB. No rales, rhonchi, or wheeze. Abdomen: soft, nontender, nondistended.    Assessment & Plan:  See Problem List

## 2014-11-19 NOTE — Patient Instructions (Signed)
Fue agradable ver que en la actualidad.  Iniciar el Lantus (insulina) en 15 unidades diarias. Aumenta 1 unidad diaria hasta que el azcar en sangre en ayunas estn en o por debajo de 100.  Seguimiento en ~ 2 semanas a 1 mes.  Asegrese de ir al [facility] para que pueda calificar y [employer] Energyobtener los medicamentos que necesita.  El Dr. Adriana Simasook,

## 2014-11-22 ENCOUNTER — Other Ambulatory Visit: Payer: Self-pay | Admitting: Family Medicine

## 2014-11-22 DIAGNOSIS — E039 Hypothyroidism, unspecified: Secondary | ICD-10-CM

## 2014-11-22 MED ORDER — LEVOTHYROXINE SODIUM 125 MCG PO TABS: 125.0000 ug | ORAL_TABLET | Freq: Every day | ORAL | Status: DC

## 2014-11-24 NOTE — Addendum Note (Signed)
Addended by: Tommie SamsOOK, Monish Haliburton G on: 11/24/2014 01:42 PM   Modules accepted: Orders

## 2014-11-25 ENCOUNTER — Other Ambulatory Visit: Payer: Self-pay | Admitting: Family Medicine

## 2014-11-25 DIAGNOSIS — E039 Hypothyroidism, unspecified: Secondary | ICD-10-CM

## 2014-11-25 NOTE — Progress Notes (Signed)
She needs to see me as previously scheduled (two weeks after last visit). She will need a lab appt in 6 weeks.

## 2014-11-26 NOTE — Progress Notes (Signed)
I have placed a future order. She will be seeing me before then.

## 2014-11-26 NOTE — Progress Notes (Signed)
-----   Message from Tommie SamsJayce G Cook, DO sent at 11/22/2014 10:09 PM EDT -----    Please inform patient that Rx for synthroid was changed to 125 mcg based on recent lab results. She needs follow up lab in 6 weeks.      LMOVM for pt to return call. Tory Septer, Maryjo RochesterJessica Dawn, CMA

## 2014-11-27 NOTE — Progress Notes (Signed)
LVM for pt to call back to inform her of below. Zimmerman Rumple, Anabeth Chilcott D, CMA  

## 2014-12-02 ENCOUNTER — Telehealth: Payer: Self-pay | Admitting: Family Medicine

## 2014-12-02 DIAGNOSIS — IMO0002 Reserved for concepts with insufficient information to code with codable children: Secondary | ICD-10-CM

## 2014-12-02 DIAGNOSIS — E1165 Type 2 diabetes mellitus with hyperglycemia: Secondary | ICD-10-CM

## 2014-12-02 MED ORDER — INSULIN GLARGINE 100 UNIT/ML SOLOSTAR PEN: 15.0000 [IU] | PEN_INJECTOR | Freq: Every day | SUBCUTANEOUS | Status: DC

## 2014-12-02 NOTE — Addendum Note (Signed)
Addended by: Jone BasemanFLEEGER, JESSICA D on: 12/02/2014 02:30 PM   Modules accepted: Orders

## 2014-12-02 NOTE — Telephone Encounter (Signed)
Rosa called and informed pt.  Have set pen aside in vaccine refrigerator. Montana Bryngelson, Maryjo RochesterJessica Dawn

## 2014-12-02 NOTE — Telephone Encounter (Signed)
Pt received pen on 18th.  Will forward to MD for ok to give sample. Hayven Fatima, Maryjo RochesterJessica Dawn

## 2014-12-02 NOTE — Telephone Encounter (Signed)
Patient can come to clinic to pick up lantus sample.

## 2014-12-02 NOTE — Telephone Encounter (Signed)
Patient asks for insulin sample for two days because she got an appointment with [name] will be tomorrow.  Please, follow up with Patient ASAP.

## 2014-12-04 ENCOUNTER — Encounter: Payer: Self-pay | Admitting: Family Medicine

## 2014-12-04 ENCOUNTER — Ambulatory Visit (INDEPENDENT_AMBULATORY_CARE_PROVIDER_SITE_OTHER): Payer: Self-pay | Admitting: Family Medicine

## 2014-12-04 VITALS — BP 114/83 | HR 70 | Temp 98.4°F | Ht 64.5 in | Wt 196.6 lb

## 2014-12-04 DIAGNOSIS — IMO0002 Reserved for concepts with insufficient information to code with codable children: Secondary | ICD-10-CM

## 2014-12-04 DIAGNOSIS — E039 Hypothyroidism, unspecified: Secondary | ICD-10-CM

## 2014-12-04 DIAGNOSIS — E1165 Type 2 diabetes mellitus with hyperglycemia: Secondary | ICD-10-CM

## 2014-12-04 DIAGNOSIS — E785 Hyperlipidemia, unspecified: Secondary | ICD-10-CM

## 2014-12-04 MED ORDER — ROSUVASTATIN CALCIUM 20 MG PO TABS: 20.0000 mg | ORAL_TABLET | Freq: Every day | ORAL | Status: DC

## 2014-12-04 MED ORDER — METFORMIN HCL 1000 MG PO TABS: 1000.0000 mg | ORAL_TABLET | Freq: Two times a day (BID) | ORAL | Status: DC

## 2014-12-04 MED ORDER — LEVOTHYROXINE SODIUM 125 MCG PO TABS: 125.0000 ug | ORAL_TABLET | Freq: Every day | ORAL | Status: DC

## 2014-12-04 MED ORDER — INSULIN GLARGINE 100 UNIT/ML SOLOSTAR PEN: 30.0000 [IU] | PEN_INJECTOR | Freq: Every day | SUBCUTANEOUS | Status: DC

## 2014-12-04 NOTE — Patient Instructions (Signed)
Fue agradable ver que en la actualidad.  Seguir aumentando la unidad Lantus 1 da (o ms si lo desea basado en sus azcares) hasta que su ayuno es inferior a 100.  Detener la glipizida. Continuar la metformina.  Llmame si se llega a 60 unidades y los niveles de azcar todava no se controlan.  Cita de seguimiento en el laboratorio a partir de julio.  Seguimiento en aproximadamente 1 mes.

## 2014-12-05 NOTE — Progress Notes (Signed)
   Subjective:    Patient ID: [name], female    DOB: [birth year], 39 y.o.   MRN: [id number]  HPI 39 year old female with uncontrolled DM-2, Hypothyroidism and HLD presents for follow up.  Spanish interpreter Silver CliffGraciella use today.   1) DM-2  At her last visit on 5/18 we started insulin therapy with Lantus.  Patient has titrated her Lantus up and is currently taking 30 units daily.  Fasting CBGs ranged from 110's to nearly 300.   Patient endorses compliance with Lantus as well as Sulfonylurea and metformin.  Compliance - Yes.   Medication side effects  Hypoglycemia: Yes. Patient has had some afternoon/evening hypoglycemia; this is likely related to Sulfonylurea.   Preventative Healthcare up to date? Referral was placed for pathology exam. Patient is awaiting this. I advised her that her weight may be up to several months given the fact that she has orange card.  2) HLD  Uncontrolled; secondary to uncontrolled DM.   Patient currently taking atorvastatin.  [name]  They have Crestor on formulary and she is requesting switch to this today.  No reported side effects from medication. She is tolerating well.  3) Hypothyroidism  Stable but last TSH was suppressed.  At her last visit the Synthroid dose was decreased to 125.  Patient states that she is doing well regarding this. She does report significant fatigue as well as hunger. No reports of palpitations or tremors. No significant changes in weight.  Social Hx - Nonsmoker.   Review of Systems  Constitutional: Positive for fatigue.  Cardiovascular: Negative for palpitations.  Endocrine: Positive for polyphagia.  Neurological: Negative for tremors.      Objective:   Physical Exam Filed Vitals:   12/04/14 1610  BP: 114/83  Pulse: 70  Temp: 98.4 F (36.9 C)   Vital signs reviewed.  Exam: General: well appearing, NAD. Cardiovascular: RRR. No murmurs, rubs, or  gallops. Respiratory: CTAB. No rales, rhonchi, or wheeze. Abdomen: soft, nontender, nondistended.ts.    Assessment & Plan:  See Problem List

## 2014-12-05 NOTE — Assessment & Plan Note (Signed)
Glycemic control is improving. Patient is to continue titrating up her Lantus dose. I'm stopping the sulfonylurea today given recent hypoglycemia. Patient is to continue metformin. Patient is to follow-up within the next month prior to my departure from family medicine clinic.

## 2014-12-05 NOTE — Assessment & Plan Note (Signed)
Uncontrolled secondary to uncontrolled diabetes. Glycemic control is improving and I expect hyperlipidemia to improve as well. Switching to Crestor per patient request as it is on the [name] program formulary.

## 2014-12-05 NOTE — Assessment & Plan Note (Signed)
TSH suppressed as of recent.  Continuing Synthroid 125 MCG. Patient will need follow-up TSH at the beginning of July.

## 2014-12-11 ENCOUNTER — Telehealth: Payer: Self-pay | Admitting: Family Medicine

## 2014-12-11 NOTE — Telephone Encounter (Signed)
Patient was prescribed insulin and went to [name] to get it, but Patient will have to wait for three more weeks. Patient, requests samples because she does not have insulin for tomorrow. Please, follow up with Patient.

## 2014-12-11 NOTE — Telephone Encounter (Signed)
Spoke with pt and informed her that at this time we do not have any samples, i told her I had sent a message to PCP for him to advise and we would contact her once we heard back from him. Lamonte Sakai, April D, [place]

## 2014-12-12 NOTE — Telephone Encounter (Signed)
Just to clarify, Would her dosage be the same with Levemir as the dosage of Lantus?[name], Karlissa Aron D, [place]

## 2014-12-12 NOTE — Telephone Encounter (Signed)
We can substitute Levemir for Lantus. Have her come in to pick some up.

## 2014-12-12 NOTE — Telephone Encounter (Signed)
Yes same dose

## 2014-12-15 NOTE — Telephone Encounter (Signed)
LMOVM for pt to return call.  Please advised that we can give samples of levemir per Dr. Adriana Simas if she would like. Fleeger, Maryjo Rochester

## 2014-12-29 ENCOUNTER — Other Ambulatory Visit: Payer: Self-pay | Admitting: *Deleted

## 2014-12-29 DIAGNOSIS — IMO0002 Reserved for concepts with insufficient information to code with codable children: Secondary | ICD-10-CM

## 2014-12-29 DIAGNOSIS — E1165 Type 2 diabetes mellitus with hyperglycemia: Secondary | ICD-10-CM

## 2014-12-29 NOTE — Telephone Encounter (Signed)
Please send correct dosage and directions to [name].  Clovis PuMartin, Tamika L, RN

## 2014-12-30 MED ORDER — INSULIN GLARGINE 100 UNIT/ML SOLOSTAR PEN: 30.0000 [IU] | PEN_INJECTOR | Freq: Every day | SUBCUTANEOUS | Status: DC

## 2014-12-31 ENCOUNTER — Ambulatory Visit (INDEPENDENT_AMBULATORY_CARE_PROVIDER_SITE_OTHER): Payer: Self-pay | Admitting: Family Medicine

## 2014-12-31 VITALS — BP 113/49 | HR 70 | Temp 98.1°F | Ht 62.0 in | Wt 199.0 lb

## 2014-12-31 DIAGNOSIS — E1165 Type 2 diabetes mellitus with hyperglycemia: Secondary | ICD-10-CM

## 2014-12-31 DIAGNOSIS — IMO0002 Reserved for concepts with insufficient information to code with codable children: Secondary | ICD-10-CM

## 2014-12-31 MED ORDER — INSULIN GLARGINE 100 UNIT/ML SOLOSTAR PEN: 40.0000 [IU] | PEN_INJECTOR | Freq: Every day | SUBCUTANEOUS | Status: DC

## 2014-12-31 MED ORDER — INSULIN GLARGINE 100 UNIT/ML SOLOSTAR PEN: PEN_INJECTOR | SUBCUTANEOUS | Status: DC

## 2014-12-31 NOTE — Progress Notes (Signed)
   Subjective:    Patient ID: [name], female    DOB: Jan 30, 1976, 39 y.o.   MRN: [id number]  HPI 39 year old female with HTN, HLD, Hypothyroidism and uncontrolled DM-2 presents for follow up regarding DM-2.  1) DM-2  Uncontrolled.  Patient currently getting medications from [name] but has been unable to get insulin.   She has not followed up and has been out of insulin for approximately 2 weeks.   She is not checking her blood sugar at home.  She is compliant with metformin.  No reported fever, chills, nausea, vomiting, weight loss.   Review of Systems Per HPI    Objective:   Physical Exam Filed Vitals:   12/31/14 1555  BP: 113/49  Pulse: 70  Temp: 98.1 F (36.7 C)   Vital signs reviewed.  Exam: General: Well-appearing obese female in no acute distress.    Assessment & Plan:  See problem list.

## 2015-01-01 ENCOUNTER — Ambulatory Visit: Payer: Self-pay | Admitting: Family Medicine

## 2015-01-01 NOTE — Assessment & Plan Note (Signed)
Patient given samples of Lantus today. Patient is to continue 40 units daily with titration up one unit daily until fasting CBGs are less than 100. Patient is awaiting insulin from [name]. Encouraged patient to follow-up closely with new primary physician.

## 2015-02-06 ENCOUNTER — Telehealth: Payer: Self-pay | Admitting: Internal Medicine

## 2015-02-06 NOTE — Telephone Encounter (Signed)
Patient wants to report her PCP that she reached 60 units of insulin. Patient needs instructions and prescription. Please, follow up with Patient (Spanish).

## 2015-02-09 NOTE — Telephone Encounter (Signed)
Pt already has an appt tomorrow with Dr. Althea Charon to discuss her insulin concerns.  Jazmin Hartsell,CMA

## 2015-02-09 NOTE — Telephone Encounter (Signed)
Thanks Jazmin 

## 2015-02-09 NOTE — Telephone Encounter (Signed)
Please schedule patient to have an appointment with me to discuss her diabetes and insulin management. Please ask her to make sure that she brings her blood sugar log! Thanks, Dr. Earlene Plater

## 2015-02-10 ENCOUNTER — Encounter: Payer: Self-pay | Admitting: Family Medicine

## 2015-02-10 ENCOUNTER — Telehealth: Payer: Self-pay | Admitting: *Deleted

## 2015-02-10 ENCOUNTER — Ambulatory Visit (INDEPENDENT_AMBULATORY_CARE_PROVIDER_SITE_OTHER): Payer: Self-pay | Admitting: Family Medicine

## 2015-02-10 VITALS — BP 117/81 | HR 66 | Temp 98.3°F | Ht 62.0 in | Wt 193.2 lb

## 2015-02-10 DIAGNOSIS — E1165 Type 2 diabetes mellitus with hyperglycemia: Secondary | ICD-10-CM

## 2015-02-10 DIAGNOSIS — IMO0002 Reserved for concepts with insufficient information to code with codable children: Secondary | ICD-10-CM

## 2015-02-10 MED ORDER — INSULIN GLARGINE 100 UNIT/ML SOLOSTAR PEN: 30.0000 [IU] | PEN_INJECTOR | Freq: Two times a day (BID) | SUBCUTANEOUS | Status: DC

## 2015-02-10 NOTE — Telephone Encounter (Signed)
Dawn from [name] called to verify is patient had a max dosage to stop for the Lantus and to verify if patient had a dosage change.  Patient seen in clinic this morning by Dr. Althea Charon.  Patient's Lantus dosage was changed and max dosage is 40 Units in AM and 40 Units in the PM.  Verbal order given by Dr. Benedict Needy for the max dosage. Clovis Pu, RN

## 2015-02-10 NOTE — Patient Instructions (Addendum)
Dear [name], Thank you for coming in to clinic today.  1. Switched your Lantus Insulin to 30 units twice daily (8 am and 8 pm), start increasing by 1 unit for each dose (both doses so up to 31 and 31, then 32 and 32) every 3 days if fasting sugar in morning is still above 120 in morning. 2. New rx printed 3. Please write down all blood sugar results, check 3 to 4 times daily - fasting in morning, 2 hours after dinner, and bedtime.  Please schedule a follow-up appointment with Dr. Earlene Plater (meet new doctor and discuss Diabetes follow-up) will need A1c lab, within next 2 to 4 weeks.  If you have any other questions or concerns, please feel free to call the clinic to contact me. You may also schedule an earlier appointment if necessary.  However, if your symptoms get significantly worse, please go to the Emergency Department to seek immediate medical attention.  Saralyn Pilar, DO [facility]

## 2015-02-10 NOTE — Progress Notes (Signed)
   Subjective:    Patient ID: [name], female    DOB: [birth year], 39 y.o.   MRN: [id number]  Patient presents for a same day appointment. History provided by patient in Spanish, with assistance by [employer] present during visit.  HPI  CHRONIC DM, Type 2: Reports she has gone up on her Lantus insulin to 60u daily and was advised previously by PCP to contact [facility] for re-evaluation, likely need to make change to insulin dose. Diagnosed >3-4 years ago, previously poorly controlled on POs. She started on insulin about 2-3 months ago, gets insulin via [name]. CBGs: Avg 140 to 180, Low 119, High 241 (per memory, did not have CBG logs). Checks CBGs fasting 1x daily in AM Meds: Lantus 60u daily (had been at 40u daily since 12/2014 and titrated up 1u daily), Metformin  BID Reports good compliance. Tolerating well w/o side-effects Currently NOT on ACEi (d/t cough, per report and chart review from 2012). Not on ARB. Lifestyle: Diet (Improved DM2 diet) Admits polyuria Denies hypoglycemia, visual changes, numbness or tingling.  I have reviewed and updated the following as appropriate: allergies and current medications  Social Hx: - Never smoker  Review of Systems  See above HPI    Objective:   Physical Exam  BP 117/81 mmHg  Pulse 66  Temp(Src) 98.3 F (36.8 C) (Oral)  Ht  (1.575 m)  Wt 193 lb 3.2 oz (87.635 kg)  BMI 35.33 kg/m2  Gen - well-appearing, comfortable, pleasant, NAD HEENT - oropharynx clear, MMM Heart - RRR, no murmurs heard Abd - soft, NTND, no masses, +active BS Ext - non-tender, no edema, peripheral pulses intact +2 b/l Skin - warm, dry Neuro - awake, alert, oriented, grossly non-focal    Assessment & Plan:   See specific A&P problem list for details.

## 2015-02-11 LAB — HM DIABETES EYE EXAM

## 2015-02-11 NOTE — Assessment & Plan Note (Signed)
Uncontrolled. On Lantus insulin for past 3 months, continue titration up now to 60u daily, still elevated fasting CBGs per report. - Last A1c 11.2 (11/19/2014), relatively stable from 11.6 in 08/2014 No complications or hypoglycemia. Cough with ACE. Not clear if trial on ARB.  Plan:  1. Switch Lantus to BID dosing given up to 60u daily now. New rx printed for pt to bring to [name] today for Lantus 30u BID, titrate instructions up by 1u BID q 3 days for fasting CBG >120 in AM. Later contacted by [name] and provided max dose up to 40u BID until needs re-evaluation. 2. Continue Metformin  BID 3. Bring CBG logs to next visit, advised will need to check CBG more regularly with BID dosing for future adjustments 4. On crestor - LDL elevated ("not calculated" in 08/2014), consider repeat direct LDL 5. Consider trial on low dose ARB 6. Lifestyle Mods - Continue improved DM diet (dec carbs, inc vegs), exercise 7. RTC 2-4 weeks for routine DM2 follow-up with PCP (this was SDA visit), will be due for repeat A1c, Ophtho exam

## 2015-02-18 ENCOUNTER — Ambulatory Visit: Payer: Self-pay | Admitting: Internal Medicine

## 2015-02-23 ENCOUNTER — Ambulatory Visit (INDEPENDENT_AMBULATORY_CARE_PROVIDER_SITE_OTHER): Payer: Self-pay | Admitting: Internal Medicine

## 2015-02-23 ENCOUNTER — Encounter: Payer: Self-pay | Admitting: Internal Medicine

## 2015-02-23 VITALS — BP 119/75 | HR 64 | Temp 98.3°F | Ht 62.0 in | Wt 197.0 lb

## 2015-02-23 DIAGNOSIS — E1165 Type 2 diabetes mellitus with hyperglycemia: Secondary | ICD-10-CM

## 2015-02-23 DIAGNOSIS — E039 Hypothyroidism, unspecified: Secondary | ICD-10-CM

## 2015-02-23 DIAGNOSIS — IMO0002 Reserved for concepts with insufficient information to code with codable children: Secondary | ICD-10-CM

## 2015-02-23 LAB — POCT GLYCOSYLATED HEMOGLOBIN (HGB A1C): Hemoglobin A1C: 10.8

## 2015-02-23 MED ORDER — INSULIN LISPRO 100 UNIT/ML ~~LOC~~ SOLN: 10.0000 [IU] | Freq: Three times a day (TID) | SUBCUTANEOUS | Status: DC

## 2015-02-23 NOTE — Assessment & Plan Note (Signed)
Uncontrolled. Taking Lantus 34u BID. Still elevated fasting CBGs per report.  A1C today is 10.8, stable from A1C of 11.2 in May 2016.  No complications or hypoglycemia.   Plan:  1. Continue Lantus and Metformin. Add Humalog 10u with meals for prandial coverage. Spoke directly to [facility] to fill this medicine.  2. Explained importance of keeping a blood sugar log. I have asked the patient to check her blood sugars fasting AM, after each meal, and before bedtime.  3. Will recheck Microalbumin and BMET in 6 months. Last Microalbumin 7.9 and Cr stable. Patient not hypertensive. Do not feel need to add ARB at this point.  4. Lifestyle modifications ---continue improved DM diet, exervise  5. Return to clinic for DM2 f/u in 2-4 weeks with sugar log

## 2015-02-23 NOTE — Progress Notes (Signed)
Subjective:    Patient ID: [name], female    DOB: September 13, 1975, 39 y.o.   MRN: [id number]  HPI  CC: DM Type II Follow up  -At last visit for poorly controlled diabetes, she was prescribed Lantus 30u BID, titrate 1u BID q 3 days for fasting CBG >120 in AM. Patient is currently taking 34u BID. -CBGs: Avg 132-150 AM fasting. Is not checking any other sugars.  -Meds: Lantus 34u BID, metformin  BID -Reports compliance. Tolerating well without side effects.  -Currently not on ACEi 2/2 cough. Last Microalbumin 6 months ago 7.9.  -Diet: States she is trying to improve. Denies eating fried or fast food. Dessert 3x/week. Denies drinking soda. -Admits polyuria and polydipsia  -Denies hypoglycemia, visual changes, numbness/tingling  -Diabetic eye exam performed earlier this month and was negative for retinopathy      Review of Systems   See HPI for ROS. All other systems reviewed and are negative.  Past medical history, surgical, family, and social history reviewed and updated in the EMR as appropriate. Objective:  BP 119/75 mmHg  Pulse 64  Temp(Src) 98.3 F (36.8 C) (Oral)  Ht  (1.575 m)  Wt 197 lb (89.359 kg)  BMI 36.02 kg/m2 Vitals and nursing note reviewed  Gen-WD, WN, NAD  Heart: RRR, no murmurs appreaciated Lungs: CTAB Ext: No edema, peripheral pulses intact and equal  Skin: Warm, dry Neuro: Alert and oriented   Assessment & Plan:  See Problem List Documentation

## 2015-02-23 NOTE — Patient Instructions (Signed)
It was great seeing you today!   1. We are going to start you on a meal time coverage insulin, take 10 units of short acting insulin with each meal 15 minutes before eating.  2. Please check your blood sugars once in the morning before eating, after each meal, and before bedtime.    Please bring all your medications to every doctors visit  Sign up for My Chart to have easy access to your labs results, and communication with your Primary care physician.  Next Appointment  Please call to make an appointment with Dr. Earlene Plater in 2-4 weeks    I look forward to talking with you again at our next visit. If you have any questions or concerns before then, please call the clinic at [phone].  Take Care,   Dr. Marcy Siren

## 2015-02-23 NOTE — Assessment & Plan Note (Signed)
Missed scheduled repeat of TSH in July. Will check TSH levels today and adjust levothyroxine prn. Currently taking Synthroid and reports good compliance.

## 2015-02-24 ENCOUNTER — Telehealth: Payer: Self-pay | Admitting: *Deleted

## 2015-02-24 LAB — TSH: TSH: 3.134 u[IU]/mL (ref 0.350–4.500)

## 2015-02-24 NOTE — Telephone Encounter (Signed)
Dawn with [name] called needing clarification regarding Humalog.  Should Humalog be taken with the Lantus dosage.  Directions for the Humalog Inject 0.1 mLs (10 Units total) into the skin 3 (three) times daily before meals. Please give [name] a call at [phone].  Clovis Pu, RN

## 2015-02-26 ENCOUNTER — Telehealth: Payer: Self-pay | Admitting: *Deleted

## 2015-02-26 NOTE — Telephone Encounter (Signed)
-----   Message from Arvilla Market, DO sent at 02/25/2015 11:21 PM EDT ----- Regarding: Lab Result Would you please call the patient to let her know that her thyroid levels were normal and that she is on the correct dose of her thyroid medication. Thanks, Dr. Earlene Plater   ----- Message -----    From: Jennette Bill, CMA    Sent: 02/23/2015   3:45 PM      To: Arvilla Market, DO

## 2015-02-26 NOTE — Telephone Encounter (Signed)
LVM for pt to call back to inform her of below. Zimmerman Rumple, April D, CMA  

## 2015-02-26 NOTE — Telephone Encounter (Signed)
Dawn from [name] called to request Humalog KwikPen instead of the vial.  If ok with the change please give her a call at [phone]. Clovis Pu, RN

## 2015-03-02 ENCOUNTER — Ambulatory Visit: Payer: Self-pay

## 2015-03-02 NOTE — Telephone Encounter (Signed)
I have called [name] and left a message that patient can use the Humalog KwikPen instead of the vial. Thanks, Marcy Siren

## 2015-03-25 ENCOUNTER — Other Ambulatory Visit: Payer: Self-pay | Admitting: *Deleted

## 2015-03-25 DIAGNOSIS — E039 Hypothyroidism, unspecified: Secondary | ICD-10-CM

## 2015-03-26 ENCOUNTER — Encounter: Payer: Self-pay | Admitting: Internal Medicine

## 2015-03-26 ENCOUNTER — Ambulatory Visit (INDEPENDENT_AMBULATORY_CARE_PROVIDER_SITE_OTHER): Payer: Self-pay | Admitting: Internal Medicine

## 2015-03-26 VITALS — BP 119/66 | HR 66 | Temp 98.0°F | Wt 204.0 lb

## 2015-03-26 DIAGNOSIS — E039 Hypothyroidism, unspecified: Secondary | ICD-10-CM

## 2015-03-26 DIAGNOSIS — G5601 Carpal tunnel syndrome, right upper limb: Secondary | ICD-10-CM

## 2015-03-26 DIAGNOSIS — E1165 Type 2 diabetes mellitus with hyperglycemia: Secondary | ICD-10-CM

## 2015-03-26 DIAGNOSIS — G5602 Carpal tunnel syndrome, left upper limb: Secondary | ICD-10-CM

## 2015-03-26 DIAGNOSIS — E119 Type 2 diabetes mellitus without complications: Secondary | ICD-10-CM

## 2015-03-26 DIAGNOSIS — IMO0002 Reserved for concepts with insufficient information to code with codable children: Secondary | ICD-10-CM

## 2015-03-26 DIAGNOSIS — Z975 Presence of (intrauterine) contraceptive device: Secondary | ICD-10-CM

## 2015-03-26 DIAGNOSIS — G5603 Carpal tunnel syndrome, bilateral upper limbs: Secondary | ICD-10-CM

## 2015-03-26 DIAGNOSIS — G5600 Carpal tunnel syndrome, unspecified upper limb: Secondary | ICD-10-CM | POA: Insufficient documentation

## 2015-03-26 MED ORDER — LEVOTHYROXINE SODIUM 125 MCG PO TABS: 125.0000 ug | ORAL_TABLET | Freq: Every day | ORAL | Status: DC

## 2015-03-26 NOTE — Progress Notes (Signed)
Subjective:    Patient ID: [name], female    DOB: 05/14/1976, 39 y.o.   MRN: [id number]  HPI  CC: DM Type II Follow up  T2DM: -At last visit added Humalog 10 units TID with meals due to elevated fasting blood sugars and HgB A1C of 10.8. -See picture below for blood sugar log.  -Meds: Lantus 40u BID, Humalog 10u TID, metformin  BID -Reports compliance. Tolerating well without side effects.  -Currently not on ACEi 2/2 cough. Last Microalbumin 6 months ago 7.9.  -Diet: States she is trying to improve. Denies eating fried or fast food. Denies drinking soda. Eats a consistent amount at all 3 foods.  -Denies polyuria and polydipsia  -Denies hypoglycemia, visual changes, loss of sensation in feet  -Diabetic eye exam performed earlier this month and was negative for retinopathy       Bilateral Numbness/Tingling of Hands: -Has been occuring x2 months. Worsening.  -Worse on right than on left.  -Notes repetitive hand motions while working at [employer]. -Reports similar condition while pregnant with son 5 years ago. Resolved after delivery.  -Numbness/tingling worse in the mornings. Has to sleep with her arms dropped off the bed or rested on her stomach.  -Denies weakness of hands.  -Has not tried any OTC pain medications.    Review of Systems   See HPI for ROS. All other systems reviewed and are negative.  Past medical history, surgical, family, and social history reviewed and updated in the EMR as appropriate. Objective:  BP 119/66 mmHg  Pulse 66  Temp(Src) 98 F (36.7 C) (Oral)  Wt 204 lb (92.534 kg) Vitals and nursing note reviewed  Gen-WD, WN, NAD  Heart: RRR, no murmurs appreaciated Lungs: CTAB Ext: Strength 5/5 in UE bilaterally. No thenar atrophy noted. Tinel's test negative. Phalen's test positive bilaterally. Radial pulses 2+.  Skin: Warm, dry Neuro: Alert and oriented   Assessment & Plan:  DM (diabetes mellitus), type 2, uncontrolled More  controlled since previous visit. Fasting CBGs mostly under 100 and post-prandial CBGs all under 140. Last A1C 10.8 (8/22). No complications or hypoglycemia.   Plan:  1. Continue Lantus 40 units BID, Humalog 10 units TID, and Metformin.  2. Counseled patient to check blood sugars once a day fasting and once 2 hours after a meal.  3. Recheck Microalbumin and BMET in 5 months. Last Microalbumin 7.9 and Cr stable. Patient presently not hypertensive. No need to add ARB at this point. 4. Lifestyle modifications---continue improved DM diet, exercise  5. Recheck HgbA1C in 2-3 months.  6. Counseled on symptoms of hypoglycemia. Instructed patient to check sugar if she has those symptoms and have rescue snack, juice prn for low sugars.   Carpal tunnel syndrome Positive Phalen's sign. Numbness/tingling pattern consistent with carpal tunnel syndrome. Previously had carpal tunnel 5-6 years ago with pregnancy, resolved after delivery.   Plan:  1. Prescribed bilateral cock-up splints. Patient instructed to complete 6 week trial of wearing splints nightly.  2. Instructed patient to take OTC ibuprofen. 3. If not improving, will consider injection or referral to hand sx.    Instructed to return in 1-2 months for [name] Smear, Flu vaccine, HgBA1C and TSH levels.

## 2015-03-26 NOTE — Assessment & Plan Note (Signed)
Positive Phalen's sign. Numbness/tingling pattern consistent with carpal tunnel syndrome. Previously had carpal tunnel 5-6 years ago with pregnancy, resolved after delivery.   Plan:  1. Prescribed bilateral cock-up splints. Patient instructed to complete 6 week trial of wearing splints nightly.  2. Instructed patient to take OTC ibuprofen. 3. If not improving, will consider injection or referral to hand sx.

## 2015-03-26 NOTE — Assessment & Plan Note (Signed)
More controlled since previous visit. Fasting CBGs mostly under 100 and post-prandial CBGs all under 140. Last A1C 10.8 (8/22). No complications or hypoglycemia.   Plan:  1. Continue Lantus 40 units BID, Humalog 10 units TID, and Metformin.  2. Counseled patient to check blood sugars once a day fasting and once 2 hours after a meal.  3. Recheck Microalbumin and BMET in 5 months. Last Microalbumin 7.9 and Cr stable. Patient presently not hypertensive. No need to add ARB at this point. 4. Lifestyle modifications---continue improved DM diet, exercise  5. Recheck HgbA1C in 2-3 months.  6. Counseled on symptoms of hypoglycemia. Instructed patient to check sugar if she has those symptoms and have rescue snack, juice prn for low sugars.

## 2015-03-26 NOTE — Patient Instructions (Signed)
It was great seeing you today!   1. Please keep taking your Lantus and Humalog. You are doing a great job!  2. You are overdue for a Pap smear. Please return in one month.    Please bring all your medications to every doctors visit  Sign up for My Chart to have easy access to your labs results, and communication with your Primary care physician.  Next Appointment  Please call to make an appointment with Dr. Earlene Plater in one month.    I look forward to talking with you again at our next visit. If you have any questions or concerns before then, please call the clinic at [phone].  Take Care,   Dr. Marcy Siren

## 2015-04-07 ENCOUNTER — Other Ambulatory Visit: Payer: Self-pay | Admitting: *Deleted

## 2015-04-07 MED ORDER — INSULIN LISPRO 100 UNIT/ML ~~LOC~~ SOLN: 10.0000 [IU] | Freq: Three times a day (TID) | SUBCUTANEOUS | Status: DC

## 2015-04-13 ENCOUNTER — Telehealth: Payer: Self-pay | Admitting: *Deleted

## 2015-04-13 ENCOUNTER — Other Ambulatory Visit: Payer: Self-pay | Admitting: Internal Medicine

## 2015-04-13 MED ORDER — INSULIN LISPRO 100 UNIT/ML (KWIKPEN): 10.0000 [IU] | PEN_INJECTOR | Freq: Three times a day (TID) | SUBCUTANEOUS | Status: DC

## 2015-04-13 NOTE — Telephone Encounter (Signed)
I called [name] and refilled her Humalog Kwik Pen. Thanks, Cat [employer]

## 2015-04-13 NOTE — Telephone Encounter (Signed)
Received another fax from [name] needing a refill on Humalog KwikPen.  The vials were sent in on 04/07/15.  Please send Rx for Humalog KwikPen.  Clovis Pu, RN

## 2015-04-14 ENCOUNTER — Telehealth: Payer: Self-pay | Admitting: *Deleted

## 2015-04-14 DIAGNOSIS — E039 Hypothyroidism, unspecified: Secondary | ICD-10-CM

## 2015-04-14 MED ORDER — LEVOTHYROXINE SODIUM 125 MCG PO TABS: 125.0000 ug | ORAL_TABLET | Freq: Every day | ORAL | Status: DC

## 2015-04-16 ENCOUNTER — Telehealth: Payer: Self-pay | Admitting: Internal Medicine

## 2015-04-16 NOTE — Telephone Encounter (Signed)
Pt called because the [name] program still has not received a fax from us for her Levothyroxine. Can we send this again. jw

## 2015-04-17 ENCOUNTER — Other Ambulatory Visit: Payer: Self-pay | Admitting: Internal Medicine

## 2015-04-17 DIAGNOSIS — E039 Hypothyroidism, unspecified: Secondary | ICD-10-CM

## 2015-04-17 MED ORDER — LEVOTHYROXINE SODIUM 125 MCG PO TABS: 125.0000 ug | ORAL_TABLET | Freq: Every day | ORAL | Status: DC

## 2015-04-17 NOTE — Telephone Encounter (Signed)
It looks like script was printed on 04/14/2015.  Will check with pcp to see if this was faxed to health department or given to tamika. Debany Vantol,CMA

## 2015-04-27 NOTE — Telephone Encounter (Signed)
Left message of [name] pharmacy line for refill on levothyroxine (SYNTHROID, LEVOTHROID) 125 MCG tablet: 1 tab daily; 3 refill; #30.  Clovis PuMartin, Percell Lamboy L, RN

## 2015-04-27 NOTE — Telephone Encounter (Signed)
Pt calling once more about this request and states that [facility] not [facility] from us. Pt states that she only has two pills left and if it would be easier to print the Rx and her take it to them today then she would be willing to do this. Sadie Reynolds, ASA

## 2015-04-27 NOTE — Telephone Encounter (Signed)
[facility]

## 2015-06-02 ENCOUNTER — Other Ambulatory Visit (HOSPITAL_COMMUNITY)
Admission: RE | Admit: 2015-06-02 | Discharge: 2015-06-02 | Disposition: A | Discharge status: Home / Self Care | Payer: Self-pay | Source: Ambulatory Visit | Attending: Family Medicine | Admitting: Family Medicine

## 2015-06-02 ENCOUNTER — Ambulatory Visit (INDEPENDENT_AMBULATORY_CARE_PROVIDER_SITE_OTHER): Payer: Self-pay | Admitting: Internal Medicine

## 2015-06-02 ENCOUNTER — Encounter: Payer: Self-pay | Admitting: Internal Medicine

## 2015-06-02 VITALS — BP 122/75 | HR 64 | Temp 98.5°F | Ht 62.0 in | Wt 202.7 lb

## 2015-06-02 DIAGNOSIS — IMO0001 Reserved for inherently not codable concepts without codable children: Secondary | ICD-10-CM

## 2015-06-02 DIAGNOSIS — Z01419 Encounter for gynecological examination (general) (routine) without abnormal findings: Secondary | ICD-10-CM | POA: Insufficient documentation

## 2015-06-02 DIAGNOSIS — Z0000 Encounter for general adult medical examination without abnormal findings: Secondary | ICD-10-CM

## 2015-06-02 DIAGNOSIS — Z124 Encounter for screening for malignant neoplasm of cervix: Secondary | ICD-10-CM

## 2015-06-02 DIAGNOSIS — E039 Hypothyroidism, unspecified: Secondary | ICD-10-CM

## 2015-06-02 DIAGNOSIS — E1165 Type 2 diabetes mellitus with hyperglycemia: Secondary | ICD-10-CM

## 2015-06-02 DIAGNOSIS — Z1151 Encounter for screening for human papillomavirus (HPV): Secondary | ICD-10-CM | POA: Insufficient documentation

## 2015-06-02 DIAGNOSIS — L299 Pruritus, unspecified: Secondary | ICD-10-CM

## 2015-06-02 LAB — POCT WET PREP (WET MOUNT): CLUE CELLS WET PREP WHIFF POC: NEGATIVE

## 2015-06-02 LAB — POCT GLYCOSYLATED HEMOGLOBIN (HGB A1C): Hemoglobin A1C: 8.2

## 2015-06-02 MED ORDER — INSULIN GLARGINE 100 UNIT/ML SOLOSTAR PEN: 40.0000 [IU] | PEN_INJECTOR | Freq: Two times a day (BID) | SUBCUTANEOUS | Status: DC

## 2015-06-02 NOTE — Assessment & Plan Note (Signed)
Pap smear performed today and awaiting results. IUD visualized and to remain in place until 2019. Wet prep normal.

## 2015-06-02 NOTE — Patient Instructions (Signed)
Thank you for coming to see me today. It was a pleasure. Today we talked about:   Diabetes: When you feel like your sugar is low in the middle of the night please check it and write the number down. Keep this log and come back in 3-4 weeks to see if we need to make any changes to your insulin regimen.   Annual Exam: I will call you with the results of your pap smear.   Hypothyroidism: I will call you with the results of your thyroid test to see if we need to make changes to the dose of your levothyroxine.   Please follow-up with Dr. [name] in 3-4 weeks.   If you have any questions or concerns, please [name] not hesitate to call the office at [phone].  Take Care,   Carla Sirenatherine Wallace, [name]

## 2015-06-02 NOTE — Assessment & Plan Note (Signed)
More controlled since previous visit. HgB A1C improved to 8.2 from 10.8. Reports subjective hypoglycemia. Patient to check CBG when she is feeling hypoglycemic and record these numbers. Will return in 3-4 weeks with blood sugar log and will adjust insulin regimen at that time if needed.

## 2015-06-02 NOTE — Progress Notes (Signed)
Subjective:    [name] - 39 y.o. female MRN [id number]  Date of birth: 10-Feb-1976  HPI  [name] is here for follow up of chronic medical conditions and annual exam.  1. Diabetes mellitus, Type 2 Disease Monitoring Blood Sugar Ranges: Fasting - usually 95-113 with occasional readings as high as 175 related to high sugar intake the night before Polyuria: no  Visual problems: no   Urine Microalbumin: 0.4 (08/21/14)  Last A1C: 10.8 (02/23/15)  Medication Compliance: yes  Medication Side Effects Hypoglycemia: yes --> subjectively without measurement of CBGs. Has had 2-3 awakenings in middle of the night in the past couple of months where she felt like BG was low and she needed a snack. But she didn't acutally measure her BG.   Preventitive Health Care Eye Exam: 02/11/15 without retinopathy  Foot Exam: 08/21/14 normal    2. Hypothyroidism  -currently taking levothyroxine 125 mcg with good compliance  -last TSH WNL in Aug 2016  -experiencing mild fatigue, otherwise denies symptoms of hot/cold intolerance, dry skin, weak nails, excessive changes in weight, etc.   3. Annual Exam  -IUD placed in Jan 2014  -LMP in May  -sexually active with one female partner, does not wish to have STD screening performed  -admits to some occasional vaginal discharge with pruritis  -hx of abnormal pap in 2010 (ASCUS without high risk HPV), two subsequent pap smears have been normal  -performs self breast exams; denies lumps, nipple discharge/bleeding -no family hx of breast cancer    Health Maintenance Due  Topic Date Due  . PAP SMEAR  09/29/2014  . INFLUENZA VACCINE  02/02/2015    -  reports that she has never smoked. She does not have any smokeless tobacco history on file. - Review of Systems: Per HPI. - Past Medical History: Patient Active Problem List   Diagnosis Date Noted  . IUD (intrauterine  device) in place 03/26/2015  . Carpal tunnel syndrome 03/26/2015  . Preventative health care 11/19/2014  . Obesity 06/25/2007  . Non-alcoholic fatty liver disease 06/25/2007  . Hypothyroidism 04/09/2007  . DM (diabetes mellitus), type 2, uncontrolled (HCC) 04/09/2007  . Hyperlipidemia 04/09/2007   - Medications: reviewed and updated Current Outpatient Prescriptions  Medication Sig Dispense Refill  . Insulin Glargine (LANTUS SOLOSTAR) 100 UNIT/ML Solostar Pen Inject 40 Units into the skin 2 (two) times daily. 10 pen 11  . insulin lispro (HUMALOG KWIKPEN) 100 UNIT/ML KiwkPen Inject 0.1 mLs (10 Units total) into the skin 3 (three) times daily. 15 mL 0  . insulin lispro (HUMALOG) 100 UNIT/ML injection Inject 0.1 mLs (10 Units total) into the skin 3 (three) times daily before meals. 10 mL 1  . Insulin Pen Needle 31G X 5 MM MISC Use as indicated to inject insulin. 100 each 6  . levothyroxine (SYNTHROID, LEVOTHROID) 125 MCG tablet Take 1 tablet (125 mcg total) by mouth daily. 30 tablet 3  . metFORMIN (GLUCOPHAGE) 1000 MG tablet Take 1 tablet (1,000 mg total) by mouth 2 (two) times daily. Instruction in spanish please 180 tablet 6  . rosuvastatin (CRESTOR) 20 MG tablet Take 1 tablet (20 mg total) by mouth daily. 90 tablet 3   No current facility-administered medications for this visit.     Review of Systems See HPI     Objective:   Physical Exam BP 122/75 mmHg  Pulse 64  Temp(Src) 98.5 F (36.9 C) (Oral)  Ht  (1.575 m)  Wt 202 lb 11.2 oz (91.944 kg)  BMI 37.06 kg/m2 Gen: NAD, alert, cooperative with exam, well-appearing CV: RRR, good S1/S2, no murmur, no edema, capillary refill brisk  Resp: CTABL, no wheezes, non-labored Breast Exam: No palpable masses. No TTP. No nipple drainage noted.  Pelvic Exam:        External: normal female genitalia without lesions or masses        Vagina: normal without lesions or masses        Cervix: normal without lesions or masses, IUD strings  visualized         Pap smear: performed        Samples for Wet prep obtained  Bimanual exam normal without enlargement of uterus or ovaries.         Assessment & Plan:   Preventative health care Pap smear performed today and awaiting results. IUD visualized and to remain in place until 2019. Wet prep normal.   Hypothyroidism Controlled at last visit with reported good medication compliance. TSH checked today and will adjust levothyroxine prn.   DM (diabetes mellitus), type 2, uncontrolled More controlled since previous visit. HgB A1C improved to 8.2 from 10.8. Reports subjective hypoglycemia. Patient to check CBG when she is feeling hypoglycemic and record these numbers. Will return in 3-4 weeks with blood sugar log and will adjust insulin regimen at that time if needed.      Marcy Sirenatherine Jameica Couts, D.O. 06/02/2015, 11:17 AM PGY-1, [facility]

## 2015-06-02 NOTE — Assessment & Plan Note (Addendum)
Controlled at last visit with reported good medication compliance. TSH checked today and will adjust levothyroxine prn.

## 2015-06-03 LAB — TSH: TSH: 6.939 u[IU]/mL — AB (ref 0.350–4.500)

## 2015-06-03 LAB — CYTOLOGY - PAP

## 2015-06-05 ENCOUNTER — Telehealth: Payer: Self-pay | Admitting: Internal Medicine

## 2015-06-05 DIAGNOSIS — E039 Hypothyroidism, unspecified: Secondary | ICD-10-CM

## 2015-06-05 DIAGNOSIS — IMO0001 Reserved for inherently not codable concepts without codable children: Secondary | ICD-10-CM

## 2015-06-05 DIAGNOSIS — E1165 Type 2 diabetes mellitus with hyperglycemia: Principal | ICD-10-CM

## 2015-06-05 MED ORDER — LEVOTHYROXINE SODIUM 150 MCG PO TABS: 150.0000 ug | ORAL_TABLET | Freq: Every day | ORAL | Status: DC

## 2015-06-05 MED ORDER — FLUCONAZOLE 150 MG PO TABS: 150.0000 mg | ORAL_TABLET | Freq: Once | ORAL | Status: DC

## 2015-06-05 MED ORDER — INSULIN LISPRO 100 UNIT/ML (KWIKPEN): 10.0000 [IU] | PEN_INJECTOR | Freq: Three times a day (TID) | SUBCUTANEOUS | Status: DC

## 2015-06-05 NOTE — Telephone Encounter (Signed)
Called patient to discuss results from last office visit.   TSH: Mildly elevated and patient symptomatic at visit. Will increase levothyroxine from 125 mcg to 150 mcg daily. Patient to have repeat TSH in 6-8 weeks. Rx printed and faxed to [place] [name].   Pap Smear: Negative and without HPV. Repeat Pap in 5 years. Pap was positive for fungus consistent with Candida. Patient had reported hx of vaginal pruritis with discharge but no discharge visualized on pelvic exam with negative wet prep. Therefore didn't treat at visit. Given reported symptoms and result from pap, Rx for Diflucan printed and faxed to [name]. Instructed patient to take 1 pill and if still symptomatic in 48-72 hours, to take the second pill.   Patient also requested refill for Humalog Kwikpen. Printed and faxed this Rx too.   Marcy Sirenatherine Wallace, D.O. 06/05/2015, 2:34 PM PGY-1, [facility]

## 2015-06-22 ENCOUNTER — Ambulatory Visit: Payer: Self-pay | Admitting: Family Medicine

## 2015-07-27 ENCOUNTER — Emergency Department (INDEPENDENT_AMBULATORY_CARE_PROVIDER_SITE_OTHER)
Admission: EM | Admit: 2015-07-27 | Discharge: 2015-07-27 | Disposition: A | Discharge status: Nursing Home (Includes ICF) | Payer: Self-pay | Source: Home / Self Care | Attending: Family Medicine | Admitting: Family Medicine

## 2015-07-27 ENCOUNTER — Encounter (HOSPITAL_COMMUNITY): Payer: Self-pay | Admitting: Emergency Medicine

## 2015-07-27 DIAGNOSIS — Z3202 Encounter for pregnancy test, result negative: Secondary | ICD-10-CM | POA: Insufficient documentation

## 2015-07-27 DIAGNOSIS — R1032 Left lower quadrant pain: Secondary | ICD-10-CM

## 2015-07-27 DIAGNOSIS — E785 Hyperlipidemia, unspecified: Secondary | ICD-10-CM | POA: Insufficient documentation

## 2015-07-27 DIAGNOSIS — Z79899 Other long term (current) drug therapy: Secondary | ICD-10-CM | POA: Insufficient documentation

## 2015-07-27 DIAGNOSIS — Z794 Long term (current) use of insulin: Secondary | ICD-10-CM | POA: Insufficient documentation

## 2015-07-27 DIAGNOSIS — E119 Type 2 diabetes mellitus without complications: Secondary | ICD-10-CM | POA: Insufficient documentation

## 2015-07-27 DIAGNOSIS — E079 Disorder of thyroid, unspecified: Secondary | ICD-10-CM | POA: Insufficient documentation

## 2015-07-27 DIAGNOSIS — R1012 Left upper quadrant pain: Secondary | ICD-10-CM

## 2015-07-27 DIAGNOSIS — R81 Glycosuria: Secondary | ICD-10-CM

## 2015-07-27 DIAGNOSIS — Z202 Contact with and (suspected) exposure to infections with a predominantly sexual mode of transmission: Secondary | ICD-10-CM | POA: Insufficient documentation

## 2015-07-27 DIAGNOSIS — N760 Acute vaginitis: Secondary | ICD-10-CM | POA: Insufficient documentation

## 2015-07-27 LAB — CBC
HCT: 39.4 % (ref 36.0–46.0)
Hemoglobin: 13.9 g/dL (ref 12.0–15.0)
MCH: 30.9 pg (ref 26.0–34.0)
MCHC: 35.3 g/dL (ref 30.0–36.0)
MCV: 87.6 fL (ref 78.0–100.0)
PLATELETS: 183 10*3/uL (ref 150–400)
RBC: 4.5 MIL/uL (ref 3.87–5.11)
RDW: 12.2 % (ref 11.5–15.5)
WBC: 9.5 10*3/uL (ref 4.0–10.5)

## 2015-07-27 LAB — URINE MICROSCOPIC-ADD ON: RBC / HPF: NONE SEEN RBC/hpf (ref 0–5)

## 2015-07-27 LAB — POCT URINALYSIS DIP (DEVICE)
Bilirubin Urine: NEGATIVE
HGB URINE DIPSTICK: NEGATIVE
KETONES UR: NEGATIVE mg/dL
Leukocytes, UA: NEGATIVE
Nitrite: NEGATIVE
PH: 5.5 (ref 5.0–8.0)
PROTEIN: NEGATIVE mg/dL
SPECIFIC GRAVITY, URINE: 1.025 (ref 1.005–1.030)
Urobilinogen, UA: 0.2 mg/dL (ref 0.0–1.0)

## 2015-07-27 LAB — COMPREHENSIVE METABOLIC PANEL
ALK PHOS: 83 U/L (ref 38–126)
ALT: 25 U/L (ref 14–54)
AST: 20 U/L (ref 15–41)
Albumin: 3.8 g/dL (ref 3.5–5.0)
Anion gap: 11 (ref 5–15)
BILIRUBIN TOTAL: 0.9 mg/dL (ref 0.3–1.2)
BUN: 6 mg/dL (ref 6–20)
CALCIUM: 8.9 mg/dL (ref 8.9–10.3)
CO2: 25 mmol/L (ref 22–32)
CREATININE: 0.63 mg/dL (ref 0.44–1.00)
Chloride: 98 mmol/L — ABNORMAL LOW (ref 101–111)
GFR calc non Af Amer: >60 mL/min (ref >60)
Glucose, Bld: 239 mg/dL — ABNORMAL HIGH (ref 65–99)
Potassium: 3.9 mmol/L (ref 3.5–5.1)
SODIUM: 134 mmol/L — AB (ref 135–145)
Total Protein: 7 g/dL (ref 6.5–8.1)

## 2015-07-27 LAB — URINALYSIS, ROUTINE W REFLEX MICROSCOPIC
BILIRUBIN URINE: NEGATIVE
Glucose, UA: >1000 mg/dL — AB
HGB URINE DIPSTICK: NEGATIVE
Ketones, ur: NEGATIVE mg/dL
Leukocytes, UA: NEGATIVE
Nitrite: NEGATIVE
PROTEIN: NEGATIVE mg/dL
Specific Gravity, Urine: 1.02 (ref 1.005–1.030)
pH: 5.5 (ref 5.0–8.0)

## 2015-07-27 LAB — LIPASE, BLOOD: Lipase: 29 U/L (ref 11–51)

## 2015-07-27 MED ORDER — FENTANYL CITRATE (PF) 100 MCG/2ML IJ SOLN
INTRAMUSCULAR | Status: AC
  Filled 2015-07-27: qty 2

## 2015-07-27 MED ORDER — FENTANYL CITRATE (PF) 100 MCG/2ML IJ SOLN
50.0000 ug | Freq: Once | INTRAMUSCULAR | Status: AC
  Administered 2015-07-27: 50 ug via NASAL

## 2015-07-27 NOTE — ED Provider Notes (Signed)
CSN: [id number]     Arrival date & time 07/27/15  1847 History   First MD Initiated Contact with Patient 07/27/15 2017     Chief Complaint  Patient presents with  . Abdominal Pain   (Consider location/radiation/quality/duration/timing/severity/associated sxs/prior Treatment) HPI Comments: 40 year old obese female with type 2 diabetes mellitus is complaining of left upper and lower quadrant abdominal pain since last p.m. and 11:00. Rather sudden onset. The pain has been constant and getting worse over the past 20 hours. She denies having cramping pain, Does not wax or wane and is not relieved by anything. It is worsening.  She states she is having regular normal daily bowel movements and insists that she does not have constipation. She denies nausea, vomiting or diarrhea. Denies fever or chills. LMP was 07/14/2015. Denies urinary symptoms. She also states that she is    passing malodorous gas.   Past Medical History  Diagnosis Date  . Diabetes mellitus   . Thyroid disease   . Hyperlipidemia    History reviewed. No pertinent past surgical history. No family history on file. Social History  Substance Use Topics  . Smoking status: Never Smoker   . Smokeless tobacco: None  . Alcohol Use: No   OB History    No data available     Review of Systems  Constitutional: Negative for fever, activity change and fatigue.  Respiratory: Negative.   Cardiovascular: Negative.   Gastrointestinal: Positive for abdominal pain. Negative for nausea, vomiting, diarrhea and constipation.  Genitourinary: Negative for dysuria, urgency, frequency, hematuria and pelvic pain.  Skin: Negative.   Neurological: Negative.     Allergies  Ace inhibitors  Home Medications   Prior to Admission medications   Medication Sig Start Date End Date Taking? Authorizing Provider  fluconazole (DIFLUCAN) 150 MG tablet Take 1 tablet (150 mg total) by mouth once. Take 2nd tablet if still having symptoms in 48-72 hours.  06/05/15   Arvilla Market, DO  Insulin Glargine (LANTUS SOLOSTAR) 100 UNIT/ML Solostar Pen Inject 40 Units into the skin 2 (two) times daily. 06/02/15   Arvilla Market, DO  insulin lispro (HUMALOG KWIKPEN) 100 UNIT/ML KiwkPen Inject 0.1 mLs (10 Units total) into the skin 3 (three) times daily. 06/05/15   Arvilla Market, DO  Insulin Pen Needle 31G X 5 MM MISC Use as indicated to inject insulin. 11/19/14   Tommie Sams, DO  levothyroxine (SYNTHROID, LEVOTHROID) 150 MCG tablet Take 1 tablet (150 mcg total) by mouth daily. 06/05/15   Arvilla Market, DO  metFORMIN (GLUCOPHAGE) 1000 MG tablet Take 1 tablet (1,000 mg total) by mouth 2 (two) times daily. Instruction in spanish please 12/04/14   Tommie Sams, DO  rosuvastatin (CRESTOR) 20 MG tablet Take 1 tablet (20 mg total) by mouth daily. 12/04/14   Tommie Sams, DO   Meds Ordered and Administered this Visit  Medications - No data to display  BP 134/81 mmHg  Pulse 56  Temp(Src) 97.6 F (36.4 C) (Oral)  Resp 16  SpO2 96%  LMP 07/14/2015 No data found.   Physical Exam  Constitutional: She is oriented to person, place, and time. She appears well-developed and well-nourished. No distress.  Neck: Normal range of motion. Neck supple.  Cardiovascular: Normal rate, regular rhythm and normal heart sounds.   Pulmonary/Chest: Effort normal and breath sounds normal. No respiratory distress.  Abdominal: Soft. Bowel sounds are normal. She exhibits no distension and no mass. There is tenderness. There is no rebound and  no guarding.  Tenderness primarily to the left lower quadrant lesser to the left upper quadrant. No abdominal tenderness to the right hemiabdomen. No rebound or guarding.  Musculoskeletal: She exhibits no edema.  Neurological: She is alert and oriented to person, place, and time. No cranial nerve deficit. She exhibits normal muscle tone.  Skin: Skin is warm and dry.  Psychiatric: She has a normal mood and  affect.  Nursing note and vitals reviewed.   ED Course  Procedures (including critical care time)  Labs Review Labs Reviewed  POCT URINALYSIS DIP (DEVICE) - Abnormal; Notable for the following:    Glucose, UA >=1000 (*)    All other components within normal limits   Results for orders placed or performed during the hospital encounter of 07/27/15  POCT urinalysis dip (device)  Result Value Ref Range   Glucose, UA >=1000 (A) NEGATIVE mg/dL   Bilirubin Urine NEGATIVE NEGATIVE   Ketones, ur NEGATIVE NEGATIVE mg/dL   Specific Gravity, Urine 1.025 1.005 - 1.030   Hgb urine dipstick NEGATIVE NEGATIVE   pH 5.5 5.0 - 8.0   Protein, ur NEGATIVE NEGATIVE mg/dL   Urobilinogen, UA 0.2 0.0 - 1.0 mg/dL   Nitrite NEGATIVE NEGATIVE   Leukocytes, UA NEGATIVE NEGATIVE     Imaging Review No results found.   Visual Acuity Review  Right Eye Distance:   Left Eye Distance:   Bilateral Distance:    Right Eye Near:   Left Eye Near:    Bilateral Near:         MDM   1. Left lower quadrant pain   2. Left upper quadrant pain    Transfer patient to ED via shuttle or private car for evaluation of left hemi-abdominal pain. It is likely the patient is having abdominal gas pain with potential distal colonic stool retention however the patient insists that she is not constipated. The pain has been steadily getting worse over the past 20 hours without relief even with bowel movement. The exam does  NOT suggest an acute abdomen at this time. No peritoneal signs.    Hayden Rasmussen, NP 07/27/15 1610  Hayden Rasmussen, NP 07/27/15 2059

## 2015-07-27 NOTE — ED Notes (Signed)
Pt. reports generalized abdominal pain with nausea onset last night , denies emesis or diarrhea , no fever or chills.

## 2015-07-27 NOTE — ED Notes (Signed)
Reports abdominal pain that started last night.  Gradually getting worse.  Denies urinary symptoms.  Patient has pain upper and lower left abdomen.  Patient has had a bm today described as normal, but no relief with this.  Denies urinary symptom.  No nausea, no vomiting or diarrhea.

## 2015-07-28 ENCOUNTER — Emergency Department (HOSPITAL_COMMUNITY)
Admission: EM | Admit: 2015-07-28 | Discharge: 2015-07-28 | Disposition: A | Discharge status: Home / Self Care | Payer: Self-pay | Attending: Emergency Medicine | Admitting: Emergency Medicine

## 2015-07-28 ENCOUNTER — Emergency Department (HOSPITAL_COMMUNITY): Payer: Self-pay

## 2015-07-28 DIAGNOSIS — R1032 Left lower quadrant pain: Secondary | ICD-10-CM

## 2015-07-28 DIAGNOSIS — A64 Unspecified sexually transmitted disease: Secondary | ICD-10-CM

## 2015-07-28 DIAGNOSIS — N760 Acute vaginitis: Secondary | ICD-10-CM

## 2015-07-28 DIAGNOSIS — B9689 Other specified bacterial agents as the cause of diseases classified elsewhere: Secondary | ICD-10-CM

## 2015-07-28 LAB — WET PREP, GENITAL
SPERM: NONE SEEN
TRICH WET PREP: NONE SEEN
YEAST WET PREP: NONE SEEN

## 2015-07-28 LAB — GC/CHLAMYDIA PROBE AMP (~~LOC~~) NOT AT ARMC
Chlamydia: NEGATIVE
Neisseria Gonorrhea: NEGATIVE

## 2015-07-28 LAB — PREGNANCY, URINE: Preg Test, Ur: NEGATIVE

## 2015-07-28 MED ORDER — CEFTRIAXONE SODIUM 250 MG IJ SOLR
250.0000 mg | Freq: Once | INTRAMUSCULAR | Status: AC
  Administered 2015-07-28: 250 mg via INTRAMUSCULAR
  Filled 2015-07-28: qty 250

## 2015-07-28 MED ORDER — LIDOCAINE HCL (PF) 1 % IJ SOLN
2.0000 mL | Freq: Once | INTRAMUSCULAR | Status: AC
  Administered 2015-07-28: 2 mL

## 2015-07-28 MED ORDER — METRONIDAZOLE 500 MG PO TABS: 500.0000 mg | ORAL_TABLET | Freq: Two times a day (BID) | ORAL | Status: DC

## 2015-07-28 MED ORDER — LIDOCAINE HCL (PF) 1 % IJ SOLN
INTRAMUSCULAR | Status: AC
  Filled 2015-07-28: qty 5

## 2015-07-28 MED ORDER — AZITHROMYCIN 250 MG PO TABS
1000.0000 mg | ORAL_TABLET | Freq: Once | ORAL | Status: AC
  Administered 2015-07-28: 1000 mg via ORAL
  Filled 2015-07-28: qty 4

## 2015-07-28 NOTE — ED Notes (Signed)
Patient transported to Ultrasound 

## 2015-07-28 NOTE — Discharge Instructions (Signed)
Abdominal Pain, Adult [name], continue to take antibiotics for treatment of your infection. Your ultrasound shows an ovarian cyst on the right side. See a primary care physician within 3 days for close follow-up. If any symptoms worsen come back to the emergency department immediately. Thank you. Many things can cause belly (abdominal) pain. Most times, the belly pain is not dangerous. Many cases of belly pain can be watched and treated at home. HOME CARE   Do not take medicines that help you go poop (laxatives) unless told to by your doctor.  Only take medicine as told by your doctor.  Eat or drink as told by your doctor. Your doctor will tell you if you should be on a special diet. GET HELP IF:  You do not know what is causing your belly pain.  You have belly pain while you are sick to your stomach (nauseous) or have runny poop (diarrhea).  You have pain while you pee or poop.  Your belly pain wakes you up at night.  You have belly pain that gets worse or better when you eat.  You have belly pain that gets worse when you eat fatty foods.  You have a fever. GET HELP RIGHT AWAY IF:   The pain does not go away within 2 hours.  You keep throwing up (vomiting).  The pain changes and is only in the right or left part of the belly.  You have bloody or tarry looking poop. MAKE SURE YOU:   Understand these instructions.  Will watch your condition.  Will get help right away if you are not doing well or get worse.   This information is not intended to replace advice given to you by your health care provider. Make sure you discuss any questions you have with your health care provider.   Document Released: 12/07/2007 Document Revised: 07/11/2014 Document Reviewed: 02/27/2013 Elsevier Interactive Patient Education [employer]

## 2015-07-28 NOTE — ED Provider Notes (Signed)
CSN: [id number]     Arrival date & time 07/27/15  2115 History  By signing my name below, I, Iona Beard, attest that this documentation has been prepared under the direction and in the presence of Tomasita Crumble, MD.   Electronically Signed: Iona Beard, ED Scribe. 07/28/2015. 2:27 AM    Chief Complaint  Patient presents with  . Abdominal Pain     The history is provided by the patient. No language interpreter was used.    HPI Comments: [name] is a 40 y.o. female who presents to the Emergency Department complaining of gradual onset, constant, diffuse, abdominal pain, onset three days ago. Pt reports that the pain is similar to her pain during her menstrual cycle The pain is alleviated slightly when the pt stands. She states that she took an ibuprofen PTA with no relief. Pt was given fentanyl injection in ED which improved symptoms. Pt was seen at [facility] yesterday where she had a UA which showed elevated glucose levels.She denies vomiting, fever, diarrhea, and SOB. She reports her LNMP was 1/19 and has had normal urination and bowel movements. No abdominal surgeries.    Past Medical History  Diagnosis Date  . Diabetes mellitus   . Thyroid disease   . Hyperlipidemia    History reviewed. No pertinent past surgical history. No family history on file. Social History  Substance Use Topics  . Smoking status: Never Smoker   . Smokeless tobacco: None  . Alcohol Use: No   OB History    No data available     Review of Systems 10 Systems reviewed and all are negative for acute change except as noted in the HPI.  Allergies  Ace inhibitors  Home Medications   Prior to Admission medications   Medication Sig Start Date End Date Taking? Authorizing Provider  Insulin Glargine (LANTUS SOLOSTAR) 100 UNIT/ML Solostar Pen Inject 40 Units into the skin 2 (two) times daily. 06/02/15  Yes Arvilla Market, DO  insulin lispro (HUMALOG KWIKPEN) 100 UNIT/ML KiwkPen  Inject 0.1 mLs (10 Units total) into the skin 3 (three) times daily. 06/05/15  Yes Arvilla Market, DO  levothyroxine (SYNTHROID, LEVOTHROID) 150 MCG tablet Take 1 tablet (150 mcg total) by mouth daily. 06/05/15  Yes Arvilla Market, DO  metFORMIN (GLUCOPHAGE) 1000 MG tablet Take 1 tablet (1,000 mg total) by mouth 2 (two) times daily. Instruction in spanish please 12/04/14  Yes Tommie Sams, DO  rosuvastatin (CRESTOR) 20 MG tablet Take 1 tablet (20 mg total) by mouth daily. 12/04/14  Yes Jayce G Cook, DO   BP 125/78 mmHg  Pulse 63  Temp(Src) 98.3 F (36.8 C) (Oral)  Resp 16  Ht  (1.575 m)  Wt 208 lb (94.348 kg)  BMI 38.03 kg/m2  SpO2 95%  LMP 07/14/2015 Physical Exam  Constitutional: She is oriented to person, place, and time. She appears well-developed and well-nourished. No distress.  HENT:  Head: Normocephalic and atraumatic.  Nose: Nose normal.  Mouth/Throat: Oropharynx is clear and moist. No oropharyngeal exudate.  Eyes: Conjunctivae and EOM are normal. Pupils are equal, round, and reactive to light. No scleral icterus.  Neck: Normal range of motion. Neck supple. No JVD present. No tracheal deviation present. No thyromegaly present.  Cardiovascular: Normal rate, regular rhythm and normal heart sounds.  Exam reveals no gallop and no friction rub.   No murmur heard. Pulmonary/Chest: Effort normal and breath sounds normal. No respiratory distress. She has no wheezes. She exhibits no tenderness.  Abdominal: Soft. Bowel sounds are normal. She exhibits no distension and no mass. There is no tenderness. There is no rebound and no guarding.  Genitourinary: Vaginal discharge found.  Moderate amount of yellow and white vaginal discharge seen coming from the cervical os. Friable cervix noted. No CMT or adnexal tenderness.  Musculoskeletal: Normal range of motion. She exhibits no edema or tenderness.  Lymphadenopathy:    She has no cervical adenopathy.  Neurological: She is  alert and oriented to person, place, and time. No cranial nerve deficit. She exhibits normal muscle tone.  Skin: Skin is warm and dry. No rash noted. No erythema. No pallor.  Nursing note and vitals reviewed.   ED Course  Procedures  DIAGNOSTIC STUDIES: Oxygen Saturation is 95% on RA, normal by my interpretation.    COORDINATION OF CARE: 2:11 AM-Discussed treatment plan which includes sublimaze injection, US pelvis complete, US transvaginal, and urine pregnancy test with pt at bedside and pt agreed to plan.   Labs Review Labs Reviewed  WET PREP, GENITAL - Abnormal; Notable for the following:    Clue Cells Wet Prep HPF POC PRESENT (*)    WBC, Wet Prep HPF POC MANY (*)    All other components within normal limits  COMPREHENSIVE METABOLIC PANEL - Abnormal; Notable for the following:    Sodium 134 (*)    Chloride 98 (*)    Glucose, Bld 239 (*)    All other components within normal limits  URINALYSIS, ROUTINE W REFLEX MICROSCOPIC (NOT AT [facility]) - Abnormal; Notable for the following:    Glucose, UA >1000 (*)    All other components within normal limits  URINE MICROSCOPIC-ADD ON - Abnormal; Notable for the following:    Squamous Epithelial / LPF 0-5 (*)    Bacteria, UA RARE (*)    All other components within normal limits  LIPASE, BLOOD  CBC  PREGNANCY, URINE  POC URINE PREG, ED  GC/CHLAMYDIA PROBE AMP ([place]) NOT AT [facility]                                                              All other components within normal limits  LIPASE, BLOOD  CBC  PREGNANCY, URINE  POC URINE PREG, ED  GC/CHLAMYDIA PROBE AMP ([place]) NOT AT [facility]    Imaging Review US Transvaginal Non-ob  07/28/2015  CLINICAL DATA:  Acute onset of left lower quadrant abdominal pain. Initial encounter. EXAM: TRANSABDOMINAL AND TRANSVAGINAL ULTRASOUND OF PELVIS TECHNIQUE: Both transabdominal and transvaginal ultrasound examinations of the pelvis were performed. Transabdominal technique was  performed for global imaging of the pelvis including uterus, ovaries, adnexal regions, and pelvic cul-de-sac. It was necessary to proceed with endovaginal exam following the transabdominal exam to visualize the ovaries in greater detail. COMPARISON:  None FINDINGS: Uterus Measurements: 10.0 x 4.4 x 6.7 cm. No fibroids or other mass visualized. Endometrium Thickness: 0.3 cm. An intrauterine device is noted in expected position at the fundus of the uterus. Right ovary Measurements: 3.4 x 3.0 x 3.2 cm. A mildly complex 2.2 cm cystic focus may reflect a small hemorrhagic cyst. Left ovary Measurements: 3.9 x 2.4 x 3.5 cm. Normal appearance/no adnexal mass. Other findings No free fluid is seen within the pelvic cul-de-sac. Limited Doppler evaluation demonstrates normal color Doppler blood flow  with respect to both ovaries. IMPRESSION: 1. No evidence for ovarian torsion. 2. 2.2 cm mildly complex cystic focus at the right adnexa may reflect a small hemorrhagic cyst. 3. Uterus unremarkable in appearance. Intrauterine device noted in expected position. Electronically Signed   By: Roanna Raider M.D.   On: 07/28/2015 04:15   US Pelvis Complete  07/28/2015  CLINICAL DATA:  Acute onset of left lower quadrant abdominal pain. Initial encounter. EXAM: TRANSABDOMINAL AND TRANSVAGINAL ULTRASOUND OF PELVIS TECHNIQUE: Both transabdominal and transvaginal ultrasound examinations of the pelvis were performed. Transabdominal technique was performed for global imaging of the pelvis including uterus, ovaries, adnexal regions, and pelvic cul-de-sac. It was necessary to proceed with endovaginal exam following the transabdominal exam to visualize the ovaries in greater detail. COMPARISON:  None FINDINGS: Uterus Measurements: 10.0 x 4.4 x 6.7 cm. No fibroids or other mass visualized. Endometrium Thickness: 0.3 cm. An intrauterine device is noted in expected position at the fundus of the uterus. Right ovary Measurements: 3.4 x 3.0 x 3.2 cm. A  mildly complex 2.2 cm cystic focus may reflect a small hemorrhagic cyst. Left ovary Measurements: 3.9 x 2.4 x 3.5 cm. Normal appearance/no adnexal mass. Other findings No free fluid is seen within the pelvic cul-de-sac. Limited Doppler evaluation demonstrates normal color Doppler blood flow with respect to both ovaries. IMPRESSION: 1. No evidence for ovarian torsion. 2. 2.2 cm mildly complex cystic focus at the right adnexa may reflect a small hemorrhagic cyst. 3. Uterus unremarkable in appearance. Intrauterine device noted in expected position. Electronically Signed   By: Roanna Raider M.D.   On: 07/28/2015 04:15   I have personally reviewed and evaluated these images and lab results as part of my medical decision-making.   EKG Interpretation None      MDM   Final diagnoses:  LLQ abdominal pain   Patient presents to the ED for LLQ abdominal pain.  PE reveals vag DC, concerning for STD.  She was treated with ceftriaxone and azithromycin.  WP also reveals clue cells.  She will be DC with flagyl to take for treatment.  PCP fu advised.  TVUS reveals a hemorrhagic cyst on the right, however patient was having more pain on the left.  She was told of findings.  She appears well and in NAD.  VS remain within her normal limits and she is safe for DC.   I personally performed the services described in this documentation, which was scribed in my presence. The recorded information has been reviewed and is accurate.      Tomasita Crumble, MD 07/28/15 [phone]

## 2015-08-03 ENCOUNTER — Ambulatory Visit: Payer: Self-pay

## 2015-08-18 ENCOUNTER — Encounter: Payer: Self-pay | Admitting: Internal Medicine

## 2015-08-18 ENCOUNTER — Ambulatory Visit (INDEPENDENT_AMBULATORY_CARE_PROVIDER_SITE_OTHER): Payer: Self-pay | Admitting: Internal Medicine

## 2015-08-18 VITALS — BP 120/72 | HR 61 | Temp 98.4°F | Ht 62.0 in | Wt 206.0 lb

## 2015-08-18 DIAGNOSIS — M65311 Trigger thumb, right thumb: Secondary | ICD-10-CM | POA: Insufficient documentation

## 2015-08-18 DIAGNOSIS — E039 Hypothyroidism, unspecified: Secondary | ICD-10-CM

## 2015-08-18 DIAGNOSIS — E038 Other specified hypothyroidism: Secondary | ICD-10-CM

## 2015-08-18 DIAGNOSIS — M79644 Pain in right finger(s): Secondary | ICD-10-CM

## 2015-08-18 NOTE — Patient Instructions (Addendum)
For your thumb pain, please use the thumb spica while at work and doing daily activity. Use ibuprofen daily for inflammation and try the ice bathes we discussed.   Please follow up with me in the next few weeks for management of your chronic medical problems. Bring a blood sugar log.   Take Care,   Dr. Earlene Plater

## 2015-08-18 NOTE — Assessment & Plan Note (Signed)
Differential diagnosis includes OA and tendonitis of the thumb from repetitive motions. Doubt fracture as patient unable to remember any type of trauma. Patient does have history of carpal tunnel syndrome bilaterally but tests today are negative and history does not sound suspicious for ongoing carpal tunnel symptoms. -thumb spica splint ordered, patient to try at least 2 week trial of wearing it while at work/doing household activities  -discussed that she should not wear splint 24 hours a day  -ice baths discussed  -ibuprofen for pain control  -return if pain not improving with splint, at that point could consider imaging and re-evaluation of carpal tunnel syndrome

## 2015-08-18 NOTE — Progress Notes (Signed)
Subjective:    [name] - 40 y.o. female MRN [id number]  Date of birth: [birth year]  HPI  [name] is 40 y.o. female presenting with right thumb pain.   Right Thumb Pain: Began 2-3 weeks ago and has gradually worsened. Reports feeling like her thumb is catching. Pain with repetitive pressing of buttons at work and with gripping heavy objects. Denies falls or trauma to the area. Believes she has had some swelling around the thenar eminence. Denies erythema or increased warmth to the area. Has tried ibuprofen with some relief. No fevers at home. Denies pain in other fingers, wrist or arm. Denies numbness/tingling of the extremity.    -  reports that she has never smoked. She does not have any smokeless tobacco history on file. - Review of Systems: Per HPI. - Past Medical History: Patient Active Problem List   Diagnosis Date Noted  . Thumb pain 08/18/2015  . IUD (intrauterine device) in place 03/26/2015  . Carpal tunnel syndrome 03/26/2015  . Obesity 06/25/2007  . Non-alcoholic fatty liver disease 06/25/2007  . Hypothyroidism 04/09/2007  . DM (diabetes mellitus), type 2, uncontrolled (HCC) 04/09/2007  . Hyperlipidemia 04/09/2007   - Medications: reviewed and updated Current Outpatient Prescriptions  Medication Sig Dispense Refill  . Insulin Glargine (LANTUS SOLOSTAR) 100 UNIT/ML Solostar Pen Inject 40 Units into the skin 2 (two) times daily. 10 pen 11  . insulin lispro (HUMALOG KWIKPEN) 100 UNIT/ML KiwkPen Inject 0.1 mLs (10 Units total) into the skin 3 (three) times daily. 15 mL 3  . levothyroxine (SYNTHROID, LEVOTHROID) 150 MCG tablet Take 1 tablet (150 mcg total) by mouth daily. 30 tablet 3  . metFORMIN (GLUCOPHAGE) 1000 MG tablet Take 1 tablet (1,000 mg total) by mouth 2 (two) times daily. Instruction in spanish please 180 tablet 6  . metroNIDAZOLE (FLAGYL) 500 MG tablet Take 1 tablet (500 mg total) by mouth 2 (two) times daily. One po bid x 7 days 14  tablet 0  . rosuvastatin (CRESTOR) 20 MG tablet Take 1 tablet (20 mg total) by mouth daily. 90 tablet 3   No current facility-administered medications for this visit.     Objective:   Physical Exam BP 120/72 mmHg  Pulse 61  Temp(Src) 98.4 F (36.9 C) (Oral)  Ht  (1.575 m)  Wt 206 lb (93.441 kg)  BMI 37.67 kg/m2  LMP 07/14/2015 Gen: NAD, alert, cooperative with exam, well-appearing C V: 2+ radial pulses  MSK: No edema, erythema, or increased warmth of the right thumb. TTP over the DIP joint and CMC joint of the right thumb. Some catching of the tendon visualized with flexion and extension of thumb. ROM appears to be grossly intact. Negative Finkelstein's test. Negative tinel's and Phalen's test of right extremity. No palpable nodules or joint deformity appreciated on exam of right hand.  Neuro: Strength 5/5 in the UE bilaterally. Sensation grossly intact throughout the right thumb.     Assessment & Plan:   Thumb pain Differential diagnosis includes OA and tendonitis of the thumb from repetitive motions. Doubt fracture as patient unable to remember any type of trauma. Patient does have history of carpal tunnel syndrome bilaterally but tests today are negative and history does not sound suspicious for ongoing carpal tunnel symptoms. -thumb spica splint ordered, patient to try at least 2 week trial of wearing it while at work/doing household activities  -discussed that she should not wear splint 24 hours a day  -ice baths discussed  -ibuprofen for pain control  -  return if pain not improving with splint, at that point could consider imaging and re-evaluation of carpal tunnel syndrome      Marcy Siren, D.O. 08/18/2015, 10:37 AM PGY-1, [facility]

## 2015-09-03 ENCOUNTER — Ambulatory Visit (INDEPENDENT_AMBULATORY_CARE_PROVIDER_SITE_OTHER): Payer: Self-pay | Admitting: Internal Medicine

## 2015-09-03 ENCOUNTER — Encounter: Payer: Self-pay | Admitting: Internal Medicine

## 2015-09-03 VITALS — BP 121/62 | HR 62 | Temp 98.6°F | Wt 202.0 lb

## 2015-09-03 DIAGNOSIS — E1165 Type 2 diabetes mellitus with hyperglycemia: Secondary | ICD-10-CM

## 2015-09-03 DIAGNOSIS — M79644 Pain in right finger(s): Secondary | ICD-10-CM

## 2015-09-03 DIAGNOSIS — E785 Hyperlipidemia, unspecified: Secondary | ICD-10-CM

## 2015-09-03 DIAGNOSIS — E119 Type 2 diabetes mellitus without complications: Secondary | ICD-10-CM

## 2015-09-03 DIAGNOSIS — E039 Hypothyroidism, unspecified: Secondary | ICD-10-CM

## 2015-09-03 DIAGNOSIS — Z794 Long term (current) use of insulin: Secondary | ICD-10-CM

## 2015-09-03 DIAGNOSIS — IMO0001 Reserved for inherently not codable concepts without codable children: Secondary | ICD-10-CM

## 2015-09-03 LAB — BASIC METABOLIC PANEL
BUN: 13 mg/dL (ref 7–25)
CALCIUM: 8.6 mg/dL (ref 8.6–10.2)
CO2: 24 mmol/L (ref 20–31)
CREATININE: 0.76 mg/dL (ref 0.50–1.10)
Chloride: 103 mmol/L (ref 98–110)
GLUCOSE: 339 mg/dL — AB (ref 65–99)
Potassium: 4 mmol/L (ref 3.5–5.3)
Sodium: 136 mmol/L (ref 135–146)

## 2015-09-03 LAB — LIPID PANEL
CHOL/HDL RATIO: 3.6 ratio (ref <=5.0)
CHOLESTEROL: 156 mg/dL (ref 125–200)
HDL: 43 mg/dL — AB (ref >=46)
LDL Cholesterol: 70 mg/dL (ref <130)
Triglycerides: 215 mg/dL — ABNORMAL HIGH (ref <150)
VLDL: 43 mg/dL — ABNORMAL HIGH (ref <30)

## 2015-09-03 LAB — TSH: TSH: 1.82 m[IU]/L

## 2015-09-03 LAB — POCT GLYCOSYLATED HEMOGLOBIN (HGB A1C): HEMOGLOBIN A1C: 9.2

## 2015-09-03 NOTE — Assessment & Plan Note (Signed)
Patient with ongoing history of thumb pain. Has tried over 2 week trial of thumb spica splint with little improvement in pain. Differential diagnosis still includes OA and tendonitis. Low suspicion for fracture remains as patient unable to remember a traumatic event. Patient's PMH is significant for carpal tunnel although recent carpal tunnel tests have been negative and history does not sound suspicious for ongoing symptoms.  -sports medicine referral placed for further evaluation and treatment  -continue to wear thumb spica splint while at work  -continue with NSAIDs and ice baths as needed for pain control

## 2015-09-03 NOTE — Assessment & Plan Note (Signed)
Last TSH elevated. Reported good medication compliance and currently asymptomatic.  -TSH checked  -will adjust levothyroxine prn

## 2015-09-03 NOTE — Assessment & Plan Note (Signed)
Uncontrolled with worsening of HgB A1C for 8.2 to 9.2 this visit. Suspect due to patient admitting poor medication compliance. Her reported CBGs are within reason but she did not bring blood sugar log with her this visit.  -continue current insulin regimen  -BMET and urine microalbumin collected today  -return in 3 months for repeat A1C and bring blood sugar log to that visit

## 2015-09-03 NOTE — Assessment & Plan Note (Signed)
Uncontrolled secondary to uncontrolled T2DM. Glycemic control has improved overall in the past year; however, patient reports inconsistent compliance with Crestor.  -lipid panel ordered today

## 2015-09-03 NOTE — Progress Notes (Signed)
Subjective:    [name] - 40 y.o. female MRN [id number]  Date of birth: 02/26/1976  HPI  [name] is here for follow up of her chronic medical conditions.  Diabetes mellitus, Type 2 Disease Monitoring Blood Sugar Ranges: Fasting - 98-125; Random - usually around 138, reports one as high as 165. Polyuria: No  Visual problems: no   Patient denies numbness/tingling in feet.   Urine Microalbumin 0.4 on 08/21/14  Last A1C: 8.2 on 06/02/15  Medication Compliance: Reports forgetting to take her Humalog 3x per week at lunch or dinner. Remembers to take Lantus every day.  Current regimen is Lantus 40u BID and Humalog 10u TID.  Medication Side Effects Hypoglycemia: no   Preventitive Health Care Eye Exam: 02/11/15 without retinopathy   Foot Exam: Performed today   Reports that she just started exercising yesterday. Did 1 hour of cardio yesterday on the treadmill. Committed to sticking with an exercise program and plans to work out 5 days a week. Discussing with cousin who is physical trainer who has given her meal recommendations as well.    Hypothyroidism -Currently taking levothyroxine 125 mcg with good compliance   -last TSH on 06/02/15 elevated to 6.939  -patient reports no symptoms of heat/cold intolerance, fatigue, changes in hair or nails    Hyperlipidemia: Takes Crestor but reports that she forgets to take it about 3 times per week.   Right Thumb pain: Patient reports pain has mildly improved since office visit approximately 3 weeks ago. Reports good compliance with thumb spica splint. Has been taking NSAIDs for pain relief. Does not increase in catching sensation and severe popping like pain when it catches. Denies numbness/tingling of the right extremity.   Health Maintenance:  Health Maintenance Due  Topic Date Due  . FOOT EXAM  08/22/2015  . URINE MICROALBUMIN  08/22/2015     -  reports that she has never smoked. She does not have any smokeless tobacco history on file. - Review of Systems: Per HPI. - Past Medical History: Patient Active Problem List   Diagnosis Date Noted  . Thumb pain 08/18/2015  . IUD (intrauterine device) in place 03/26/2015  . Carpal tunnel syndrome 03/26/2015  . Obesity 06/25/2007  . Non-alcoholic fatty liver disease 06/25/2007  . Hypothyroidism 04/09/2007  . DM (diabetes mellitus), type 2, uncontrolled (HCC) 04/09/2007  . Hyperlipidemia 04/09/2007   - Medications: reviewed and updated Current Outpatient Prescriptions  Medication Sig Dispense Refill  . Insulin Glargine (LANTUS SOLOSTAR) 100 UNIT/ML Solostar Pen Inject 40 Units into the skin 2 (two) times daily. 10 pen 11  . insulin lispro (HUMALOG KWIKPEN) 100 UNIT/ML KiwkPen Inject 0.1 mLs (10 Units total) into the skin 3 (three) times daily. 15 mL 3  . levothyroxine (SYNTHROID, LEVOTHROID) 150 MCG tablet Take 1 tablet (150 mcg total) by mouth daily. 30 tablet 3  . metFORMIN (GLUCOPHAGE) 1000 MG tablet Take 1 tablet (1,000 mg total) by mouth 2 (two) times daily. Instruction in spanish please 180 tablet 6  . metroNIDAZOLE (FLAGYL) 500 MG tablet Take 1 tablet (500 mg total) by mouth 2 (two) times daily. One po bid x 7 days 14 tablet 0  . rosuvastatin (CRESTOR) 20 MG tablet Take 1 tablet (20 mg total) by mouth daily. 90 tablet 3   No current facility-administered medications for this visit.       Objective:   Physical Exam BP 121/62 mmHg  Pulse 62  Temp(Src) 98.6 F (37 C) (Oral)  Wt 202 lb (91.627  kg) Gen: NAD, alert, cooperative with exam, well-appearing CV: RRR, good S1/S2, no murmur, no edema, capillary refill brisk  Resp: CTABL, no wheezes, non-labored MSK: No edema, increased warmth, or erythema of the R thumb. Catching of tendon visualized with flexion and extension of thumb. ROM grossly intact. TTP over DIP and CMC joint of R thumb. Negative Finkelstein's test. No  palpable nodule appreciated.   Diabetic Foot Check -  Appearance - no lesions, ulcers or calluses Skin - no unusual pallor or redness Monofilament testing - normal bilaterally across balls of feet, diminished across heels bilaterally  Gross sensation greatly intact across both feet bilaterally.  Right - Great toe, medial, central, lateral ball and posterior foot intact Left - Great toe, medial, central, lateral ball and posterior foot intact       Assessment & Plan:   Hypothyroidism Last TSH elevated. Reported good medication compliance and currently asymptomatic.  -TSH checked  -will adjust levothyroxine prn   DM (diabetes mellitus), type 2, uncontrolled Uncontrolled with worsening of HgB A1C for 8.2 to 9.2 this visit. Suspect due to patient admitting poor medication compliance. Her reported CBGs are within reason but she did not bring blood sugar log with her this visit.  -continue current insulin regimen  -BMET and urine microalbumin collected today  -return in 3 months for repeat A1C and bring blood sugar log to that visit   Hyperlipidemia Uncontrolled secondary to uncontrolled T2DM. Glycemic control has improved overall in the past year; however, patient reports inconsistent compliance with Crestor.  -lipid panel ordered today   Thumb pain Patient with ongoing history of thumb pain. Has tried over 2 week trial of thumb spica splint with little improvement in pain. Differential diagnosis still includes OA and tendonitis. Low suspicion for fracture remains as patient unable to remember a traumatic event. Patient's PMH is significant for carpal tunnel although recent carpal tunnel tests have been negative and history does not sound suspicious for ongoing symptoms.  -sports medicine referral placed for further evaluation and treatment  -continue to wear thumb spica splint while at work  -continue with NSAIDs and ice baths as needed for pain control      Marcy Siren,  D.O. 09/03/2015, 4:45 PM PGY-1, [facility]

## 2015-09-03 NOTE — Patient Instructions (Signed)
Thank you for coming to see me today. It was a pleasure. Today we talked about:   Your Diabetes: Please continue to exercise! That's a great lifestyle change. Make sure to remember to take your insulin. Please bring a blood sugar log to your next appointment.   Your Thyroid Disorder: Keep taking your levothyroxine. I will call you with the results of your thyroid test.   Thumb Pain: I have referred you to Sports Medicine. You will be getting a call to schedule that appointment. Continue to wear your splint in the meantime.   Please follow-up with Dr. Earlene Plater in 3 months.   If you have any questions or concerns, please do not hesitate to call the office at [phone].  Take Care,   Marcy Siren, DO

## 2015-09-04 ENCOUNTER — Other Ambulatory Visit: Payer: Self-pay | Admitting: *Deleted

## 2015-09-04 ENCOUNTER — Other Ambulatory Visit: Payer: Self-pay | Admitting: Family Medicine

## 2015-09-04 LAB — MICROALBUMIN / CREATININE URINE RATIO
CREATININE, URINE: 129 mg/dL (ref 20–320)
MICROALB/CREAT RATIO: 2 ug/mg{creat} (ref <30)
Microalb, Ur: 0.3 mg/dL

## 2015-09-06 MED ORDER — METFORMIN HCL 1000 MG PO TABS: 1000.0000 mg | ORAL_TABLET | Freq: Two times a day (BID) | ORAL | Status: DC

## 2015-09-06 NOTE — Telephone Encounter (Signed)
I have refilled patient's Metformin. I have sent it to the [facility] on [address] because that was the pharmacy attached to the refill request. Patient usually fills her medications through the [name] program. Will you please call patient to let her know refill has been sent and to make sure [facility] was the pharmacy she wanted it sent to? Thanks.

## 2015-09-07 NOTE — Telephone Encounter (Signed)
Pt has appointment on 09/03/15. Fleeger, Maryjo RochesterJessica Dawn, CMA

## 2015-09-23 ENCOUNTER — Ambulatory Visit: Payer: Self-pay | Admitting: Family Medicine

## 2015-09-25 ENCOUNTER — Ambulatory Visit (INDEPENDENT_AMBULATORY_CARE_PROVIDER_SITE_OTHER): Payer: Self-pay | Admitting: Family Medicine

## 2015-09-25 VITALS — BP 115/69 | Ht 62.0 in | Wt 208.0 lb

## 2015-09-25 DIAGNOSIS — M6530 Trigger finger, unspecified finger: Secondary | ICD-10-CM

## 2015-09-25 DIAGNOSIS — M79644 Pain in right finger(s): Secondary | ICD-10-CM

## 2015-09-25 MED ORDER — METHYLPREDNISOLONE ACETATE 40 MG/ML IJ SUSP
20.0000 mg | Freq: Once | INTRAMUSCULAR | Status: AC
  Administered 2015-09-25: 20 mg via INTRA_ARTICULAR

## 2015-09-28 NOTE — Progress Notes (Signed)
  Carla PapLiliana [name] - 40 y.o. female MRN [id number]  Date of birth: 1975/09/07  CC: Right thumb pain  SUBJECTIVE:   HPI  She is here today for referral regarding right thumb pain, which has been there for several months. She has been wearing her thumb spica splint with minimal improvement.  IBU 800 mg QD does help. Also notices triggering at times. No significant swelling. No trauma.  Right handed. No weakness.   ROS:     14 point review of systems negative other than that listed above for MSK issue.  No fevers, no chills, no night sweats.  No rash.  HISTORY: Past Medical, Surgical, Social, and Family History Reviewed & Updated per EMR.  Pertinent Historical Findings include: Diabetes with recent A1c of 9.2 Obesity   OBJECTIVE: BP 115/69 mmHg  Ht 5\' 2"  (1.575 m)  Wt 208 lb (94.348 kg)  BMI 38.03 kg/m2  Physical Exam  Calm, NAD Non-labored breathing.   Hand& wrist: right Inspection normal with no visible erythema or swelling. ROM smooth and normal with good flexion and extension and ulnar/radial deviation that is symmetrical with opposite wrist. Pain over 1st MCP (A1 pulley) on right with triggering when extended from a deeply flexed position.   Negative grind of right 1st CMC and MCP.  Palpation is normal over other metacarpals, navicular, lunate, and TFCC; tendons without tenderness/ swelling Strength 5/5 in all directions without pain. Negative Finkelstein & Tinel's  Ultrasound: Long and short axis view of the 1st right MCP do no reveal and osteophyte formation or joint space narrowing.  NO mass.  A1 pulley is slightly thickened.    Consent obtained and verified. Sterile betadine prep. Furthur cleansed with alcohol. Topical analgesic spray: Ethyl chloride. Joint: A1 pulley of 1st digit on the right Approached in typical fashion with: palpation guided Completed without difficulty Meds:  1% lidocaine with methylpred 1:1, 0.5 cc/0.5cc Needle:5/8th" Aftercare instructions  and Red flags advised.  MEDICATIONS, LABS & OTHER ORDERS: Previous Medications   INSULIN GLARGINE (LANTUS SOLOSTAR) 100 UNIT/ML SOLOSTAR PEN    Inject 40 Units into the skin 2 (two) times daily.   INSULIN LISPRO (HUMALOG KWIKPEN) 100 UNIT/ML KIWKPEN    Inject 0.1 mLs (10 Units total) into the skin 3 (three) times daily.   LEVOTHYROXINE (SYNTHROID, LEVOTHROID) 150 MCG TABLET    Take 1 tablet (150 mcg total) by mouth daily.   METFORMIN (GLUCOPHAGE) 1000 MG TABLET    Take 1 tablet (1,000 mg total) by mouth 2 (two) times daily. Instruction in spanish please   METRONIDAZOLE (FLAGYL) 500 MG TABLET    Take 1 tablet (500 mg total) by mouth 2 (two) times daily. One po bid x 7 days   ROSUVASTATIN (CRESTOR) 20 MG TABLET    Take 1 tablet (20 mg total) by mouth daily.   Modified Medications   No medications on file   New Prescriptions   No medications on file   Discontinued Medications   No medications on file  No orders of the defined types were placed in this encounter.   ASSESSMENT & PLAN: Right 1st Trigger finger: Injection today.  Continue to wear thumb spica as needed. NSAIDs are reasonable as well. F/u 1 month.

## 2015-09-30 ENCOUNTER — Other Ambulatory Visit: Payer: Self-pay | Admitting: *Deleted

## 2015-09-30 DIAGNOSIS — E039 Hypothyroidism, unspecified: Secondary | ICD-10-CM

## 2015-10-05 MED ORDER — LEVOTHYROXINE SODIUM 150 MCG PO TABS: 150.0000 ug | ORAL_TABLET | Freq: Every day | ORAL | Status: DC

## 2015-10-05 NOTE — Telephone Encounter (Signed)
Printing Levothyroxine Rx. Will fax to [place] [name].   Marcy Sirenatherine Wallace, D.O. 10/05/2015, 1:15 PM PGY-1, [facility]

## 2015-10-27 ENCOUNTER — Encounter: Payer: Self-pay | Admitting: Family Medicine

## 2015-10-27 ENCOUNTER — Ambulatory Visit (INDEPENDENT_AMBULATORY_CARE_PROVIDER_SITE_OTHER): Payer: Self-pay | Admitting: Family Medicine

## 2015-10-27 VITALS — BP 120/80 | Ht 62.0 in | Wt 208.0 lb

## 2015-10-27 DIAGNOSIS — M6530 Trigger finger, unspecified finger: Secondary | ICD-10-CM

## 2015-10-27 NOTE — Progress Notes (Signed)
  Carla PapLiliana [name] - 40 y.o. female MRN [id number]  Date of birth: September 30, 1975  CC: Right thumb pain  SUBJECTIVE:   HPI  09/25/2015 visit: [name] is here today for referral regarding right thumb pain, which has been there for several months. She has been wearing her thumb spica splint with minimal improvement.  IBU 800 mg QD does help. Also notices triggering at times. No significant swelling. No trauma.  Right handed. No weakness.   Today's visit: [name] is here with her interpreter today. She has been doing well since her right A1 pulley injection just over 1 month ago. She noticed a steady decrease in her pain. She does have continued catching of her thumb, but denies any pain at this point. She is not needing any medications and is not wearing her brace on a regular basis.  ROS:     14 point review of systems negative other than that listed above for MSK issue.  No fevers, no chills, no night sweats.  No rash.  HISTORY: Past Medical, Surgical, Social, and Family History Reviewed & Updated per EMR.  Pertinent Historical Findings include: Diabetes with recent A1c of 9.2 Obesity   OBJECTIVE: BP 120/80 mmHg  Ht 5\' 2"  (1.575 m)  Wt 208 lb (94.348 kg)  BMI 38.03 kg/m2  Physical Exam  Calm, NAD Non-labored breathing.   Hand& wrist: right Inspection normal with no visible erythema or swelling. ROM relatively smooth and normal with good flexion and extension and ulnar/radial deviation that is symmetrical with opposite wrist. No pain over 1st MCP (A1 pulley) on right without discomfort  Negative grind of right 1st CMC and MCP.  Palpation is normal over other metacarpals, navicular, lunate, and TFCC; tendons without tenderness/ swelling Strength 5/5 in all directions without pain. Negative Finkelstein & Tinel's   MEDICATIONS, LABS & OTHER ORDERS: Previous Medications   INSULIN GLARGINE (LANTUS SOLOSTAR) 100 UNIT/ML SOLOSTAR PEN    Inject 40 Units into the skin 2 (two) times daily.    INSULIN LISPRO (HUMALOG KWIKPEN) 100 UNIT/ML KIWKPEN    Inject 0.1 mLs (10 Units total) into the skin 3 (three) times daily.   LEVOTHYROXINE (SYNTHROID, LEVOTHROID) 150 MCG TABLET    Take 1 tablet (150 mcg total) by mouth daily.   METFORMIN (GLUCOPHAGE) 1000 MG TABLET    Take 1 tablet (1,000 mg total) by mouth 2 (two) times daily. Instruction in spanish please   METRONIDAZOLE (FLAGYL) 500 MG TABLET    Take 1 tablet (500 mg total) by mouth 2 (two) times daily. One po bid x 7 days   ROSUVASTATIN (CRESTOR) 20 MG TABLET    Take 1 tablet (20 mg total) by mouth daily.   Modified Medications   No medications on file   New Prescriptions   No medications on file   Discontinued Medications   No medications on file  No orders of the defined types were placed in this encounter.   ASSESSMENT & PLAN: Right 1st Trigger finger: Great response from injection last month. Although she has catching she has no pain whatsoever at this time. At this time another injection is not indicated as she has no pain. We have recommended she stay in her splint in the evening so long as she continues to have catching. She should return with any pain and we can consider another injection. Please call with any questions and follow up as needed.

## 2015-10-30 ENCOUNTER — Telehealth: Payer: Self-pay | Admitting: Internal Medicine

## 2015-10-30 NOTE — Telephone Encounter (Signed)
Patient asks refill for metformin (supply for three months is less expensive) [facility]. Please, follow up with Patient (Spanish).

## 2015-11-02 MED ORDER — METFORMIN HCL 1000 MG PO TABS: 1000.0000 mg | ORAL_TABLET | Freq: Two times a day (BID) | ORAL | Status: DC

## 2015-11-02 NOTE — Telephone Encounter (Signed)
I have sent 3 month supply  of Metformin to [facility] [address]. Please call patient to let her know (needs Spanish interpreter).   Marcy Sirenatherine Wallace, D.O. 11/02/2015, 8:43 AM PGY-1, [facility]

## 2015-11-02 NOTE — Telephone Encounter (Signed)
Pt informed of below. Zimmerman Rumple, April D, CMA  

## 2015-12-04 ENCOUNTER — Ambulatory Visit: Payer: Self-pay | Admitting: Internal Medicine

## 2015-12-21 ENCOUNTER — Ambulatory Visit (INDEPENDENT_AMBULATORY_CARE_PROVIDER_SITE_OTHER): Payer: Self-pay | Admitting: Internal Medicine

## 2015-12-21 ENCOUNTER — Encounter: Payer: Self-pay | Admitting: Internal Medicine

## 2015-12-21 VITALS — BP 119/75 | HR 62 | Temp 98.2°F | Wt 206.0 lb

## 2015-12-21 DIAGNOSIS — E039 Hypothyroidism, unspecified: Secondary | ICD-10-CM

## 2015-12-21 DIAGNOSIS — Z794 Long term (current) use of insulin: Secondary | ICD-10-CM

## 2015-12-21 DIAGNOSIS — E1165 Type 2 diabetes mellitus with hyperglycemia: Secondary | ICD-10-CM

## 2015-12-21 DIAGNOSIS — IMO0001 Reserved for inherently not codable concepts without codable children: Secondary | ICD-10-CM

## 2015-12-21 LAB — POCT GLYCOSYLATED HEMOGLOBIN (HGB A1C): Hemoglobin A1C: 9.8

## 2015-12-21 LAB — TSH: TSH: 3.13 mIU/L

## 2015-12-21 MED ORDER — SITAGLIPTIN PHOSPHATE 50 MG PO TABS: 50.0000 mg | ORAL_TABLET | Freq: Every day | ORAL | Status: DC

## 2015-12-21 MED ORDER — LOSARTAN POTASSIUM 50 MG PO TABS: 50.0000 mg | ORAL_TABLET | Freq: Every day | ORAL | Status: DC

## 2015-12-21 NOTE — Progress Notes (Signed)
   [facility] Noralee CharsAsiyah Yehonatan Grandison, MD Phone: [phone]  Reason For Visit: Diabetes follow up   # Diabetes  - CBGs - 145 in the morning, 170/160 in the afternoon,  checks blood sugars twice a day  -  Patient's sister passed away in March. She had diabetes and has been waking up with low blood sugars in the middle of night. Patient concerned that she died from low blood sugars. In March, she stopped using insulin for about 1 week due to concern that she to could die from the insulin.  - However the following week patient restarted insulin  - Takes Lantus 40 units twice daily, with the exception of the 1 week never misses a dose. Takes  Humalog 10 units TID, misses mid-day dose about 3 times a week.   - Meal times - Breakfast, morning 8 am, Lunch is 11 - 3 pm, Dinner 5-7 pm  - 15 minutes before meal taking the humalog  - No episodes of hypoglycemia - felt a bit dizzy on Saturday but did not check blood sugar  - Exercise - Taekwondo for about 1 month ago,  Weight loss - Patient has a high carb diet, drinks more water, about once a week high-C once a week, ice cream every 2 weeks.    Past Medical History Reviewed problem list.  Medications- reviewed and updated No additions to family history Social history- patient is a non-smoker  Objective: BP 119/75 mmHg  Pulse 62  Temp(Src) 98.2 F (36.8 C) (Oral)  Wt 206 lb (93.441 kg)  LMP 12/16/2015 (Approximate) Gen: NAD, alert, cooperative with exam CV: RRR, good S1/S2, no murmur, 2 + radial pulses  Resp: CTABL, no wheezes, non-labored  Skin: no rashes no lesions  Assessment/Plan: See problem based a/p  DM (diabetes mellitus), type 2, uncontrolled Diabetes, HA1C trending up 8.2> 9.2>9.8. Goal A1C 8 Total insulin 110 units daily, Lantus 40 units BID, humalog TID 10 units. Metformin 1000 mg BID. Patient missing midday humalog about 3 times a week, otherwise pretty compliant with current regiment.  - Continue exercise and  weight loss as this will help with A1C reduction  - Reviewed signs of hypoglycemia, patient denies having any  - START  sitaGLIPtin (JANUVIA) 50 MG tablet; Take 1 tablet (50 mg total) by mouth daily, if unable to get coverage via orange card then consider increasing Humalog to 15 units TID.  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 - Continue Insulin Glargine 40 units BID, humalog 10 units TID, and Metformin    Hypothyroidism Controlled, TSH wnl  - Refilled levothyroxine (SYNTHROID, LEVOTHROID) 150 MCG tablet

## 2015-12-21 NOTE — Patient Instructions (Addendum)
   Diabetes Your diabetes is  Not well controlled Your goal is to have an A1c < 8.0 Medicine Changes: I want you start taking Januvia once daily  Homework: Please bring in your blood sugar chart at next visit  Call us if: you have any concerns about your blood sugars being low.   Come back to see us in: 4 weeks, lab visit in 1 week day for BMET   I have also started you on losartan for kidney protection. This will also lower your blood pressure. If you get dizzy often please let me know.

## 2015-12-22 ENCOUNTER — Encounter: Payer: Self-pay | Admitting: Internal Medicine

## 2015-12-22 MED ORDER — INSULIN GLARGINE 100 UNIT/ML SOLOSTAR PEN: 40.0000 [IU] | PEN_INJECTOR | Freq: Two times a day (BID) | SUBCUTANEOUS | Status: DC

## 2015-12-22 MED ORDER — INSULIN LISPRO 100 UNIT/ML (KWIKPEN): 10.0000 [IU] | PEN_INJECTOR | Freq: Three times a day (TID) | SUBCUTANEOUS | Status: DC

## 2015-12-22 MED ORDER — LEVOTHYROXINE SODIUM 150 MCG PO TABS: 150.0000 ug | ORAL_TABLET | Freq: Every day | ORAL | Status: DC

## 2015-12-22 NOTE — Assessment & Plan Note (Signed)
Diabetes, HA1C trending up 8.2> 9.2>9.8. Goal A1C 8 Total insulin 110 units daily, Lantus 40 units BID, humalog TID 10 units. Metformin 1000 mg BID. Patient missing midday humalog about 3 times a week, otherwise pretty compliant with current regiment.  - Continue exercise and weight loss as this will help with A1C reduction  - Reviewed signs of hypoglycemia, patient denies having any  - START  sitaGLIPtin (JANUVIA) 50 MG tablet; Take 1 tablet (50 mg total) by mouth daily, if unable to get coverage via orange card then consider increasing Humalog to 15 units TID.  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 - Continue Insulin Glargine 40 units BID, humalog 10 units TID, and Metformin

## 2015-12-22 NOTE — Assessment & Plan Note (Signed)
Controlled, TSH wnl  - Refilled levothyroxine (SYNTHROID, LEVOTHROID) 150 MCG tablet

## 2015-12-25 ENCOUNTER — Encounter: Payer: Self-pay | Admitting: Family Medicine

## 2015-12-25 ENCOUNTER — Ambulatory Visit (INDEPENDENT_AMBULATORY_CARE_PROVIDER_SITE_OTHER): Payer: Self-pay | Admitting: Family Medicine

## 2015-12-25 VITALS — BP 136/71 | HR 61 | Ht 62.0 in | Wt 206.0 lb

## 2015-12-25 DIAGNOSIS — M79644 Pain in right finger(s): Secondary | ICD-10-CM

## 2015-12-25 MED ORDER — METHYLPREDNISOLONE ACETATE 80 MG/ML IJ SUSP
80.0000 mg | Freq: Once | INTRAMUSCULAR | Status: AC
  Administered 2015-12-25: 80 mg via INTRA_ARTICULAR

## 2015-12-25 MED ORDER — METHYLPREDNISOLONE ACETATE 40 MG/ML IJ SUSP: 40.0000 mg | Freq: Once | INTRAMUSCULAR | Status: DC

## 2015-12-25 NOTE — Progress Notes (Signed)
Patient ID: [name], female   DOB: 1975/12/13, 40 y.o.   MRN: [id number] [facility]: Attending Note: I have reviewed the chart, discussed wit the Sports Medicine Fellow. I agree with assessment and treatment plan as detailed in the Fellow's note. Physical exam revealed very significant right trigger thumb. When flexed, it was difficult for her to even straighten it fully out and was quite painful. Ultrasound revealed a very large nodule at the A1 pulley area and injected that today. She's been using a thumb brace intermittently, can't use it at work. I'm not sure if that's being helpful. Like to see her back in 3 weeks and potentially she will need a second injection. She wants to avoid surgery if at all possible and she has no insurance so I'm not sure we could get surgery done easily if we  wanted to.

## 2015-12-25 NOTE — Progress Notes (Signed)
Interpreter for visit is [employer]

## 2015-12-25 NOTE — Progress Notes (Signed)
  Berniece PapLiliana Escobar-Lopez - 40 y.o. female MRN [id number]  Date of birth: Oct 17, 1975  CC: Right thumb pain  SUBJECTIVE:   HPI  09/25/2015 visit: [name] is here today for referral regarding right thumb pain, which has been there for several months. She has been wearing her thumb spica splint with minimal improvement.  IBU 800 mg QD does help. Also notices triggering at times. No significant swelling. No trauma.  Right handed. No weakness.   Today's visit: [name] is here with her interpreter today. Pain has been worsening over the past month to 2 months. She has had this injected 1 with 75% relief of her symptoms. Is wearing her thumb spica splint with minimal relief at this time. No paresthesias going into her fingers. Taking ibuprofen when necessary. No previous imaging.  ROS:     14 point review of systems negative other than that listed above for MSK issue.  No fevers, no chills, no night sweats.  No rash.  HISTORY: Past Medical, Surgical, Social, and Family History Reviewed & Updated per EMR.  Pertinent Historical Findings include: Diabetes with recent A1c of 9.2 Obesity   OBJECTIVE: BP 136/71 mmHg  Pulse 61  Ht 5\' 2"  (1.575 m)  Wt 206 lb (93.441 kg)  BMI 37.67 kg/m2  LMP 12/16/2015 (Approximate)  Physical Exam  Calm, NAD Non-labored breathing.   Hand& wrist: right Inspection normal with no visible erythema or swelling. ROM relatively smooth and normal with good flexion and extension and ulnar/radial deviation that is symmetrical with opposite wrist. + pain over 1st MCP (A1 pulley) on right with nodule Negative grind of right 1st CMC and MCP.  Palpation is normal over other metacarpals, navicular, lunate, and TFCC; tendons without tenderness/ swelling Strength 5/5 in all directions without pain. Negative Finkelstein & Tinel's

## 2015-12-25 NOTE — Assessment & Plan Note (Signed)
Patient with obvious trigger thumb that was palpable and visible in the room. Repeat injection today and if minimal improvement over 6 weeks I would consider A1 pulley release.   Aspiration/Injection Procedure Note [name] May 02, 1976  Procedure: Injection Indications: L trigger thumb  Procedure Details Consent: Risks of procedure as well as the alternatives and risks of each were explained to the (patient/caregiver).  Consent for procedure obtained. Time Out: Verified patient identification, verified procedure, site/side was marked, verified correct patient position, special equipment/implants available, medications/allergies/relevent history reviewed, required imaging and test results available.  Performed.  The area was cleaned with iodine and alcohol swabs.    The L A1 pulley was injected using 0.5 cc's of 40 Depomedrol and 0.5 cc's of 1% lidocaine with a 21 1 1/2" needle.  Ultrasound was used. Images were obtained in Transverse and Long views showing the injection.    A sterile dressing was applied.  Patient did tolerate procedure well. Estimated blood loss: None

## 2016-01-14 ENCOUNTER — Other Ambulatory Visit: Payer: Self-pay

## 2016-01-14 DIAGNOSIS — E1165 Type 2 diabetes mellitus with hyperglycemia: Secondary | ICD-10-CM

## 2016-01-14 LAB — BASIC METABOLIC PANEL WITH GFR
BUN: 8 mg/dL (ref 7–25)
CO2: 26 mmol/L (ref 20–31)
Calcium: 8.5 mg/dL — ABNORMAL LOW (ref 8.6–10.2)
Chloride: 103 mmol/L (ref 98–110)
Creat: 0.62 mg/dL (ref 0.50–1.10)
Glucose, Bld: 174 mg/dL — ABNORMAL HIGH (ref 65–99)
Potassium: 4.1 mmol/L (ref 3.5–5.3)
Sodium: 137 mmol/L (ref 135–146)

## 2016-01-15 ENCOUNTER — Encounter: Payer: Self-pay | Admitting: Family Medicine

## 2016-01-15 ENCOUNTER — Ambulatory Visit (INDEPENDENT_AMBULATORY_CARE_PROVIDER_SITE_OTHER): Payer: Self-pay | Admitting: Family Medicine

## 2016-01-15 VITALS — BP 104/56 | HR 61 | Ht 62.0 in | Wt 206.0 lb

## 2016-01-15 DIAGNOSIS — M65311 Trigger thumb, right thumb: Secondary | ICD-10-CM

## 2016-01-15 NOTE — Progress Notes (Signed)
   Subjective:    Patient ID: [name], female    DOB: 11/03/1975, 40 y.o.   MRN: [id number]  HPI   Interpretor: Chalmers GuestGraciella Namahira- Right thumb pain  At last office visit she had very significant trigger thumb that was extremely difficult to extend even with assistance. We gave her injection. She reports about 60-75% improvement and function and pain is about 85% better. It still little stiff. She's able to do what she needs to do. The pain is more generalized in the fact that all around her right thumb minutes mild to moderate. It gets a little worse with progression of the days activities. She tolerated the injection without any problem.   Review of Systems She noted no erythema, no swelling or unusual pain at the injection site. See history of present illness above for [place] pertinent review of systems. She denies fever.    Objective:   Physical Exam Vital signs are reviewed GEN.: Well-developed female no acute distress HAND: Right. There is still some palpable triggering at the A1 pulley of the right  Thumb. He does have full flexion and extension, normal abduction abduction and opposition. The thenar eminence muscle bulk is normal and symmetrical to that on the left. SKIN: There is no erythema, no lesions, no rash, no hypopigmentation in the area of the right thumb.       Assessment & Plan:

## 2016-01-15 NOTE — Assessment & Plan Note (Signed)
Significant improvement but not total resolution. We discussed options including doing a second injection which I don't think is really going to be that beneficial today and I would like to avoid if possible. I told her the minute starts giving her pain or interfering with her activities to call and we will work her in as soon as possible. Discussed again the long nature of this problem, the intermittent nature of flareups in the ultimate resolution which would be with surgery.

## 2016-01-21 ENCOUNTER — Ambulatory Visit (INDEPENDENT_AMBULATORY_CARE_PROVIDER_SITE_OTHER): Payer: Self-pay | Admitting: Internal Medicine

## 2016-01-21 ENCOUNTER — Encounter: Payer: Self-pay | Admitting: Internal Medicine

## 2016-01-21 VITALS — BP 112/66 | HR 72 | Temp 98.6°F | Ht 62.0 in | Wt 205.6 lb

## 2016-01-21 DIAGNOSIS — E1165 Type 2 diabetes mellitus with hyperglycemia: Secondary | ICD-10-CM

## 2016-01-21 MED ORDER — METFORMIN HCL 1000 MG PO TABS: 1000.0000 mg | ORAL_TABLET | Freq: Two times a day (BID) | ORAL | Status: DC

## 2016-01-21 NOTE — Progress Notes (Signed)
   Subjective:    [name] - 40 y.o. female MRN [id number]  Date of birth: 19-Nov-1975  HPI  [name] is here for f/u of chronic medical problems.  Diabetes mellitus, Type 2 Disease Monitoring Blood Sugar Ranges: Fasting - Usually in the low 100s. Highest 148 but says ate something sweet night before.; Random - 130-160. Polyuria: No  Visual problems: no   Urine Microalbumin 0.3, on Arb therapy   Last A1C: 9.8 (June 2017)   Medication Compliance: yes, but has been unable to get Januvia at [name]. Was called and told it would be ready today.   Medication Side Effects Hypoglycemia: no   Preventitive Health Care Eye Exam: UTD. Due in August 2017.  Foot Exam: Performed today.   [employer] 3 days per week.   Diet looking better. Trying to eat more healthy food.      Health Maintenance:  Health Maintenance Due  Topic Date Due  . FOOT EXAM  08/22/2015    -  reports that she has never smoked. She does not have any smokeless tobacco history on file. - Review of Systems: Per HPI. - Past Medical History: Patient Active Problem List   Diagnosis Date Noted  . Trigger thumb of right hand 08/18/2015  . IUD (intrauterine device) in place 03/26/2015  . Carpal tunnel syndrome 03/26/2015  . Obesity 06/25/2007  . Non-alcoholic fatty liver disease 06/25/2007  . Hypothyroidism 04/09/2007  . DM (diabetes mellitus), type 2, uncontrolled (HCC) 04/09/2007  . Hyperlipidemia 04/09/2007   - Medications: reviewed and updated    Objective:   Physical Exam BP 112/66 mmHg  Pulse 72  Temp(Src) 98.6 F (37 C) (Oral)  Ht 5\' 2"  (1.575 m)  Wt 205 lb 9.6 oz (93.26 kg)  BMI 37.60 kg/m2  LMP 12/02/2015 (Approximate) Gen: NAD, alert, cooperative with exam, well-appearing CV: RRR, good S1/S2, no murmur, no edema, capillary refill brisk  Resp: CTABL, no wheezes, non-labored  Diabetic Foot Check -    Appearance - no lesions, ulcers or calluses Skin - no unusual pallor or redness Monofilament testing - normal bilaterally  Right - Great toe, medial, central, lateral ball intact. Sensation to light touch to posterior foot diminished.  Left - Great toe, medial, central, lateral ball and posterior foot intact     Assessment & Plan:   DM (diabetes mellitus), type 2, uncontrolled HgB A1C has been slowly up-trending over past year. Patient not at goal A1C <8. Patient was started on Januvia at last OV but has been unable to get medication through [name] until today. Patient reports better compliance with midday Humalog dose since last visit.  -continue exercise and weight loss for A1C reduction  -start Januvia ASAP  -continue Lantus 40 units BID, Humalog 10 units TID, and Metformin 1000 mg BID  -continue Losartan for kidney protection -reviewed signs of hypoglycemia   -return in 2 months for DM f/u and A1C check      Marcy Sirenatherine Carolle Ishii, D.O. 01/21/2016, 9:03 AM PGY-2, [facility]

## 2016-01-21 NOTE — Assessment & Plan Note (Addendum)
HgB A1C has been slowly up-trending over past year. Patient not at goal A1C <8. Patient was started on Januvia at last OV but has been unable to get medication through [name] until today. Patient reports better compliance with midday Humalog dose since last visit.  -continue exercise and weight loss for A1C reduction  -start Januvia ASAP  -continue Lantus 40 units BID, Humalog 10 units TID, and Metformin 1000 mg BID; will not adjust insulin regimen as patient reporting good glucose control with current regimen  -continue Losartan for kidney protection -reviewed signs of hypoglycemia   -return in 2 months for DM f/u and A1C check

## 2016-01-21 NOTE — Patient Instructions (Signed)
Please pick up your Januvia at pharmacy today and start taking it.  Continue with your current insulin regimen.   Keep up the good work with exercising! Please continue trying to make healthy eating habits.   Please schedule an appointment with Dr. [name] for September to check A1C and follow up with your diabetes.

## 2016-02-11 ENCOUNTER — Ambulatory Visit: Payer: Self-pay

## 2016-03-21 LAB — HM DIABETES EYE EXAM

## 2016-05-02 ENCOUNTER — Other Ambulatory Visit: Payer: Self-pay | Admitting: *Deleted

## 2016-05-03 MED ORDER — LEVOTHYROXINE SODIUM 150 MCG PO TABS: 150.0000 ug | ORAL_TABLET | Freq: Every day | ORAL | 3 refills | Status: DC

## 2016-05-30 ENCOUNTER — Ambulatory Visit: Payer: Self-pay | Attending: Internal Medicine

## 2016-06-20 ENCOUNTER — Ambulatory Visit: Payer: Self-pay | Attending: Internal Medicine

## 2016-08-02 ENCOUNTER — Other Ambulatory Visit: Payer: Self-pay | Admitting: *Deleted

## 2016-08-03 MED ORDER — LEVOTHYROXINE SODIUM 150 MCG PO TABS: 150.0000 ug | ORAL_TABLET | Freq: Every day | ORAL | 3 refills | Status: DC

## 2016-08-03 NOTE — Telephone Encounter (Signed)
Please call patient and ask her to schedule an appointment for follow up of her chronic medical conditions.   Marcy Sirenatherine Wallace, D.O. 08/03/2016, 8:41 AM PGY-2, [facility]

## 2016-08-16 ENCOUNTER — Other Ambulatory Visit: Payer: Self-pay | Admitting: *Deleted

## 2016-08-16 MED ORDER — LEVOTHYROXINE SODIUM 150 MCG PO TABS: 150.0000 ug | ORAL_TABLET | Freq: Every day | ORAL | 3 refills | Status: DC

## 2016-08-25 ENCOUNTER — Ambulatory Visit (INDEPENDENT_AMBULATORY_CARE_PROVIDER_SITE_OTHER): Payer: Self-pay | Admitting: Family Medicine

## 2016-08-25 ENCOUNTER — Encounter: Payer: Self-pay | Admitting: Family Medicine

## 2016-08-25 VITALS — BP 110/78 | HR 81 | Temp 98.3°F | Ht 62.0 in | Wt 190.8 lb

## 2016-08-25 DIAGNOSIS — B3731 Acute candidiasis of vulva and vagina: Secondary | ICD-10-CM | POA: Insufficient documentation

## 2016-08-25 DIAGNOSIS — N898 Other specified noninflammatory disorders of vagina: Secondary | ICD-10-CM

## 2016-08-25 DIAGNOSIS — B373 Candidiasis of vulva and vagina: Secondary | ICD-10-CM

## 2016-08-25 LAB — POCT WET PREP (WET MOUNT)
Clue Cells Wet Prep Whiff POC: NEGATIVE
TRICHOMONAS WET PREP HPF POC: ABSENT

## 2016-08-25 MED ORDER — TERCONAZOLE 0.4 % VA CREA: 1.0000 | TOPICAL_CREAM | Freq: Every day | VAGINAL | 0 refills | Status: DC

## 2016-08-25 NOTE — Progress Notes (Signed)
   Subjective:    Patient ID: [name], female    DOB: [birth year], 41 y.o.   MRN: [id number]  HPI initial interpreter Casimiro NeedleMichael [phone] 2nd interpreter, Annice PihJackie [phone] 41 yo female, monagamous with vaginal discharge and pain intermitantly x 1 month.  Previous BV.  I do not see and previous STDs in her chart.  Vag discharge is pruritic, not foul smelling.  Does have pain and sore areas.  While she has had a hx of recurrent vag problems.  I do not get a clear history of recurrent sores. No urgency, frequency or dysuria.      Review of Systems     Objective:   Physical Exam Abd, no abd tenderness, No CVA tenderness. Vagina - general irritation. Has a typical looking vaginal tear at the posterior forchette.  Cervix normal.  No uterine tenderness on bimanual Wet prep is + for yeast.       Assessment & Plan:

## 2016-08-25 NOTE — Patient Instructions (Signed)
Instructions reviewed with interpreter. No sex x 2 weeks. Use terazol cream to promote healing of tear rather than oral diflucan. See us again if the painful skin changes recur

## 2016-08-25 NOTE — Assessment & Plan Note (Signed)
Yeast with vag tear is most likely explanation.  I also mentioned possibity of herpes given pain and mucosal ulcers although the exam is far more typical of tear than denuded herpes vesicles.

## 2016-09-27 ENCOUNTER — Other Ambulatory Visit: Payer: Self-pay | Admitting: *Deleted

## 2016-09-27 DIAGNOSIS — E1165 Type 2 diabetes mellitus with hyperglycemia: Principal | ICD-10-CM

## 2016-09-27 DIAGNOSIS — IMO0001 Reserved for inherently not codable concepts without codable children: Secondary | ICD-10-CM

## 2016-09-27 MED ORDER — INSULIN LISPRO 100 UNIT/ML (KWIKPEN): 10.0000 [IU] | PEN_INJECTOR | Freq: Three times a day (TID) | SUBCUTANEOUS | 3 refills | Status: DC

## 2016-10-05 ENCOUNTER — Encounter: Payer: Self-pay | Admitting: Student

## 2016-10-05 ENCOUNTER — Ambulatory Visit (INDEPENDENT_AMBULATORY_CARE_PROVIDER_SITE_OTHER): Payer: Self-pay | Admitting: Student

## 2016-10-05 VITALS — BP 100/70 | HR 62 | Temp 98.6°F | Wt 190.0 lb

## 2016-10-05 DIAGNOSIS — J069 Acute upper respiratory infection, unspecified: Secondary | ICD-10-CM

## 2016-10-05 DIAGNOSIS — B9789 Other viral agents as the cause of diseases classified elsewhere: Secondary | ICD-10-CM

## 2016-10-05 DIAGNOSIS — J029 Acute pharyngitis, unspecified: Secondary | ICD-10-CM

## 2016-10-05 LAB — POCT RAPID STREP A (OFFICE): Rapid Strep A Screen: NEGATIVE

## 2016-10-05 NOTE — Assessment & Plan Note (Signed)
Sore throat with cough and congestion in the absence of fevers consistent with viral URI. Rapid strep negative - counseled on OTC cold and flu meds like alka seltzer cold and flu - cough drops, hot teas - will follow as needed

## 2016-10-05 NOTE — Patient Instructions (Addendum)
Follow up as needed Take alka seltzer cold and flu for cold symptoms Take cough drops to help soothe your throat Take tea and warm fluids to help with throat irritation If you have questions or concerns, call the office at [phone]

## 2016-10-05 NOTE — Progress Notes (Signed)
   Subjective:    Patient ID: [name], female    DOB: December 08, 1975, 41 y.o.   MRN: [id number]   CC: sore throat  HPI: 41 y/o F presents for sore throat  Sore throat - started 5 days ago - she has had some cough and congestion - no fever, or ear pain - no difficulty swallowing or breathing - her son is curretly sick with a URI  Smoking status reviewed  Review of Systems  Per HPI, else denies chest pain, shortness of breath,   Objective:  BP 100/70   Pulse 62   Temp 98.6 F (37 C) (Oral)   Wt 190 lb (86.2 kg)   LMP 09/26/2016   SpO2 96%   BMI 34.75 kg/m  Vitals and nursing note reviewed  General: NAD Cardiac: RRR HEENT: mildly erythematous pharynx else normal oropharynx, midline uvula, normal TMs bilaterally normal ear canals, mild cervical lymphadenopathy noted Respiratory: CTAB, normal effort Neuro: alert and oriented, no focal deficits   Assessment & Plan:    Viral URI Sore throat with cough and congestion in the absence of fevers consistent with viral URI. Rapid strep negative - counseled on OTC cold and flu meds like alka seltzer cold and flu - cough drops, hot teas - will follow as needed    Alyssa A. Kennon Rounds MD, MS Family Medicine Resident PGY-3 Pager [phone]

## 2016-10-12 ENCOUNTER — Telehealth: Payer: Self-pay | Admitting: Internal Medicine

## 2016-10-12 NOTE — Telephone Encounter (Signed)
Sched 15 min DM f/u appt 11/17/16. - [name]

## 2016-11-15 ENCOUNTER — Telehealth: Payer: Self-pay | Admitting: Internal Medicine

## 2016-11-15 NOTE — Telephone Encounter (Signed)
Called to remind patient of diabetes follow-up appointment on 11/17/16 at 8:30am with Dr. [name].   Confirmed appointment date, time and location? yes  Confirmed transporation? yes  I asked the patient to please arrive 15 minutes before appointment time to get through check-in, and to bring all medications to the appointment.  - Mesha Guinyard

## 2016-11-17 ENCOUNTER — Ambulatory Visit (INDEPENDENT_AMBULATORY_CARE_PROVIDER_SITE_OTHER): Payer: Self-pay | Admitting: Internal Medicine

## 2016-11-17 VITALS — BP 115/80 | HR 68 | Wt 193.8 lb

## 2016-11-17 DIAGNOSIS — E1165 Type 2 diabetes mellitus with hyperglycemia: Secondary | ICD-10-CM

## 2016-11-17 LAB — POCT GLYCOSYLATED HEMOGLOBIN (HGB A1C): HEMOGLOBIN A1C: 13.6

## 2016-11-17 NOTE — Progress Notes (Signed)
   Subjective:    Berniece PapLiliana Escobar-Lopez - 41 y.o. female MRN [id number]  Date of birth: 10-14-1975  HPI  Berniece PapLiliana Escobar-Lopez is here for follow up of T2DM.  DIABETES Disease Monitoring: Blood Sugar ranges(Severity) -Has not been checking CBGs for about 4 months.   Associated Symptoms- Polyuria/phagia/dipsia- yes      Visual problems- no Medications: Metformin 1000 BID, Lantus 40u BID, Humalog 10u TID (last took about 4 months ago)  Compliance(Modifying factor) - Non-compliant. Barriers include being unable to take insulin during work or while taking her two children to after school activities. She also reports her hand pain resolved once she stopped taking her diabetic medications.  In terms of diet, she will try to eat healthy for a couple days but then becomes very hungry and misses her "[place] T diet---tamales, tortillas, tacos, etc". She does report that she only drinks diet soda and limits juice intake. She has been active in [employer].  Hypoglycemic symptoms- none Timing - continuous  Monitoring Labs and Parameters Last A1C:  Lab Results  Component Value Date   HGBA1C 13.6 11/17/2016   Last Lipid:     Component Value Date/Time   CHOL 156 09/03/2015 1619   HDL 43 (L) 09/03/2015 1619   LDLDIRECT 125 (H) 09/08/2008 2046   Last Bmet  Potassium  Date Value Ref Range Status  01/14/2016 4.1 3.5 - 5.3 mmol/L Final   Sodium  Date Value Ref Range Status  01/14/2016 137 135 - 146 mmol/L Final   Creat  Date Value Ref Range Status  01/14/2016 0.62 0.50 - 1.10 mg/dL Final        -  reports that she has never smoked. She has never used smokeless tobacco. - Review of Systems: Per HPI. - Past Medical History: Patient Active Problem List   Diagnosis Date Noted  . Viral URI 10/05/2016  . Yeast vaginitis 08/25/2016  . Trigger thumb of right hand 08/18/2015  . IUD (intrauterine device) in place 03/26/2015  . Carpal tunnel syndrome 03/26/2015  . Obesity 06/25/2007  .  Non-alcoholic fatty liver disease 06/25/2007  . Hypothyroidism 04/09/2007  . DM (diabetes mellitus), type 2, uncontrolled (HCC) 04/09/2007  . Hyperlipidemia 04/09/2007   - Medications: reviewed and updated   Objective:   Physical Exam BP 115/80   Pulse 68   Wt 193 lb 12.8 oz (87.9 kg)   SpO2 95%   BMI 35.45 kg/m  Gen: NAD, alert, cooperative with exam, well-appearing CV: RRR, good S1/S2, no murmur, no edema, capillary refill brisk  Resp: CTABL, no wheezes, non-labored    Assessment & Plan:   DM (diabetes mellitus), type 2, uncontrolled (HCC) Previous A1c 9.8 (June 2017). Now worsened to 13.6 likely due to lack of medications and diet adherence. Patient does not have social barriers such as financial constraints, lack of transport, lack of food, etc that are preventing her from having controlled T2DM.  Patient is agreeable to meeting with pharmacy particularly to discuss once weekly injectables, oral medications, or once per day insulin regimen. In the meantime, she is willing to resume Metformin to see how that makes her feel without insulin. She will consider a nutrition appointment.     Marcy Sirenatherine Wallace, D.O. 11/17/2016, 9:31 AM PGY-2, [facility]

## 2016-11-17 NOTE — Assessment & Plan Note (Addendum)
Previous A1c 9.8 (June 2017). Now worsened to 13.6 likely due to lack of medications and diet adherence. Patient does not have social barriers such as financial constraints, lack of transport, lack of food, etc that are preventing her from having controlled T2DM.  Patient is agreeable to meeting with pharmacy particularly to discuss once weekly injectables, oral medications, or once per day insulin regimen. In the meantime, she is willing to resume Metformin to see how that makes her feel without insulin. She will consider a nutrition appointment.

## 2016-11-17 NOTE — Patient Instructions (Signed)
Please make an appointment with Dr. Raymondo BandKoval, pharmacy, to discuss options for medications for your Diabetes. In the meantime, please start taking your Metformin again to see how you feel.   I have given you a card for the nutritionist, Dr. Gerilyn PilgrimSykes. If you decide you would like to see her, call the number on the card to make an appointment.   Take Care,   Dr. [name]

## 2016-11-29 ENCOUNTER — Encounter: Payer: Self-pay | Admitting: Pharmacist

## 2016-11-29 ENCOUNTER — Ambulatory Visit (INDEPENDENT_AMBULATORY_CARE_PROVIDER_SITE_OTHER): Payer: Self-pay | Admitting: Pharmacist

## 2016-11-29 DIAGNOSIS — E1165 Type 2 diabetes mellitus with hyperglycemia: Secondary | ICD-10-CM

## 2016-11-29 MED ORDER — INSULIN GLARGINE 100 UNIT/ML SOLOSTAR PEN: 30.0000 [IU] | PEN_INJECTOR | Freq: Every day | SUBCUTANEOUS | Status: DC

## 2016-11-29 MED ORDER — INSULIN LISPRO 100 UNIT/ML (KWIKPEN): 10.0000 [IU] | PEN_INJECTOR | Freq: Every day | SUBCUTANEOUS | Status: DC

## 2016-11-29 MED ORDER — DULAGLUTIDE 0.75 MG/0.5ML ~~LOC~~ SOAJ: 0.7500 mg | SUBCUTANEOUS | 1 refills | Status: DC

## 2016-11-29 NOTE — Progress Notes (Signed)
    S:     Chief Complaint  Patient presents with  . Medication Management    Patient arrives ambulating.  Presents for diabetes evaluation, education, and management at the request of Dr. [name]. Patient was referred on 11/17/16.  Patient was last seen by Primary Care Provider on 11/17/16.  Patient reports Diabetes was diagnosed in 2005. Pt states she took insulin for 1 year and had taken metformin and Januvia as well but is currently only taking metformin as of the past 4 months. Pt reports no issues obtaining insulin therapy previously and currently has vials and pens left. Pt states her blood sugars readings have primarily been >200. Pt reports being more active and recent weight loss.   Pt endorses difficulty controlling sugars due to the "T diet" she eats which includes tacos, tortillas, etc and is high in carbohydrates. In addition, she states frustration with the fact that attempting to control blood sugars does not work immediately and takes time. Pt states her job and erratic meal schedule makes giving herself multiple insulin shots throughout the day very difficult.  Family/Social History: Sister with DM who died after taking insulin and not eating  Patient reports adherence with medications.  Current diabetes medications include: Metformin 1000mg  BID Current hypertension medications include: Losartan 50mg  once daily  Patient reports one previous hypoglycemic event in which she did not eat enough.  Patient reported dietary habits: Eats 3 meals/day  Patient reported exercise habits: Tae Kwon Do  O:  Physical Exam  Constitutional: She appears well-developed and well-nourished.    Review of Systems  All other systems reviewed and are negative.   Lab Results  Component Value Date   HGBA1C 13.6 11/17/2016   Vitals:   11/29/16 1343  BP: 124/74  Pulse: 78    Home CBGs: mostly in 200s  A/P: Diabetes longstanding currently poorly controlled. Patient denies hypoglycemic  events and is able to verbalize appropriate hypoglycemia management plan. Patient reports adherence with metformin. Control is suboptimal due to poor adherence with insulin therapy and poor diet. -Continue metformin 1000mg  BID -Place request for this medication with [name] - sent Fax Rx today. Initiate Trulicity 0.75mg  once weekly x4 weeks and then increase to 1.5mg  weekly as tolerated -Restart Lantus at 30 units daily in the morning -Restart Humalog 10 units once daily with biggest meal of the day if blood sugars remain in high 100s after resuming Lantus and starting Trulicity  Next A1C anticipated August 2018.    ASCVD risk greater than 7.5%. Continued rosuvastatin 20 mg.   Hypertension longstanding currently controlled and below goal of <140/90 mmHg.  Patient reports adherence with medication. -Continue losartan 50mg  once daily  Written patient instructions provided.  Total time in face to face counseling 25 minutes.   Follow up in Pharmacist Clinic Visit 1 month.   Patient seen with Emeline GeneralWhitney Schlick, PharmD Candidate, and Fredonia HighlandMichael Bitonti, PharmD PGY-1 Resident.

## 2016-11-29 NOTE — Progress Notes (Signed)
Patient ID: [name], female   DOB: January 29, 1976, 41 y.o.   MRN: [id number] Reviewed: Agree with Dr. Macky LowerKoval's documentation and management.

## 2016-11-29 NOTE — Assessment & Plan Note (Signed)
Diabetes longstanding currently poorly controlled. Patient denies hypoglycemic events and is able to verbalize appropriate hypoglycemia management plan. Patient reports adherence with metformin. Control is suboptimal due to poor adherence with insulin therapy and poor diet. -Continue metformin 1000mg  BID -Place request for this medication with [name] - sent Fax Rx today. Initiate Trulicity 0.75mg  once weekly x4 weeks and then increase to 1.5mg  weekly as tolerated -Restart Lantus at 30 units daily in the morning -Restart Humalog 10 units once daily with biggest meal of the day if blood sugars remain in high 100s after resuming Lantus and starting Trulicity

## 2016-11-29 NOTE — Patient Instructions (Addendum)
It was great to see you today!  Please continue taking metformin.  Restart Lantus at 30 units daily in the morning.  Restart Humalog 10 units once daily with biggest meal of the day if blood sugars continue to be in the high 100s.  Begin taking Trulicity 0.75mg  once weekly.  Follow-up with pharmacy clinic as soon as you receive Trulicity.

## 2016-12-07 ENCOUNTER — Ambulatory Visit: Payer: Self-pay

## 2016-12-26 ENCOUNTER — Other Ambulatory Visit: Payer: Self-pay | Admitting: *Deleted

## 2016-12-26 MED ORDER — LEVOTHYROXINE SODIUM 150 MCG PO TABS: 150.0000 ug | ORAL_TABLET | Freq: Every day | ORAL | 3 refills | Status: DC

## 2017-01-13 ENCOUNTER — Other Ambulatory Visit: Payer: Self-pay | Admitting: *Deleted

## 2017-01-13 DIAGNOSIS — E1165 Type 2 diabetes mellitus with hyperglycemia: Secondary | ICD-10-CM

## 2017-01-13 MED ORDER — DULAGLUTIDE 0.75 MG/0.5ML ~~LOC~~ SOAJ: 0.7500 mg | SUBCUTANEOUS | 1 refills | Status: DC

## 2017-01-23 ENCOUNTER — Other Ambulatory Visit: Payer: Self-pay | Admitting: *Deleted

## 2017-01-23 DIAGNOSIS — E1165 Type 2 diabetes mellitus with hyperglycemia: Secondary | ICD-10-CM

## 2017-01-23 MED ORDER — INSULIN GLARGINE 100 UNIT/ML SOLOSTAR PEN: 30.0000 [IU] | PEN_INJECTOR | Freq: Every day | SUBCUTANEOUS | 3 refills | Status: DC

## 2017-02-07 ENCOUNTER — Telehealth: Payer: Self-pay | Admitting: Internal Medicine

## 2017-02-07 DIAGNOSIS — Z794 Long term (current) use of insulin: Principal | ICD-10-CM

## 2017-02-07 DIAGNOSIS — E119 Type 2 diabetes mellitus without complications: Secondary | ICD-10-CM

## 2017-02-07 NOTE — Telephone Encounter (Signed)
Needs referall to [facility].  Her appt is Sept 17.  Please advise

## 2017-02-07 NOTE — Telephone Encounter (Signed)
Have placed referral for eye exam.   Marcy Sirenatherine Calob Baskette, D.O. 02/07/2017, 11:22 AM PGY-3, [facility]

## 2017-02-14 ENCOUNTER — Other Ambulatory Visit: Payer: Self-pay | Admitting: *Deleted

## 2017-02-14 DIAGNOSIS — E1165 Type 2 diabetes mellitus with hyperglycemia: Secondary | ICD-10-CM

## 2017-02-15 MED ORDER — INSULIN LISPRO 100 UNIT/ML (KWIKPEN): 10.0000 [IU] | PEN_INJECTOR | Freq: Every day | SUBCUTANEOUS | 1 refills | Status: DC

## 2017-03-27 ENCOUNTER — Other Ambulatory Visit: Payer: Self-pay | Admitting: *Deleted

## 2017-03-27 DIAGNOSIS — E1165 Type 2 diabetes mellitus with hyperglycemia: Secondary | ICD-10-CM

## 2017-03-27 MED ORDER — DULAGLUTIDE 0.75 MG/0.5ML ~~LOC~~ SOAJ: 0.7500 mg | SUBCUTANEOUS | 1 refills | Status: DC

## 2017-05-10 ENCOUNTER — Other Ambulatory Visit: Payer: Self-pay | Admitting: Internal Medicine

## 2017-05-10 MED ORDER — LEVOTHYROXINE SODIUM 150 MCG PO TABS: 150.0000 ug | ORAL_TABLET | Freq: Every day | ORAL | 3 refills | Status: DC

## 2017-08-10 ENCOUNTER — Other Ambulatory Visit: Payer: Self-pay

## 2017-08-10 DIAGNOSIS — E1165 Type 2 diabetes mellitus with hyperglycemia: Secondary | ICD-10-CM

## 2017-08-13 MED ORDER — INSULIN GLARGINE 100 UNIT/ML SOLOSTAR PEN: 30.0000 [IU] | PEN_INJECTOR | Freq: Every day | SUBCUTANEOUS | 3 refills | Status: DC

## 2017-08-18 ENCOUNTER — Other Ambulatory Visit: Payer: Self-pay

## 2017-08-18 MED ORDER — LEVOTHYROXINE SODIUM 150 MCG PO TABS: 150.0000 ug | ORAL_TABLET | Freq: Every day | ORAL | 3 refills | Status: DC

## 2017-08-21 ENCOUNTER — Telehealth: Payer: Self-pay

## 2017-08-21 NOTE — Telephone Encounter (Signed)
Called [name] and left message to refill Lantus and Synthroid.   Marcy Sirenatherine Tehani Mersman, D.O. 08/21/2017, 2:50 PM PGY-3, [facility]

## 2017-08-21 NOTE — Telephone Encounter (Signed)
[facility] that the following prescriptions say they were faxed but were not received. Please call as a verbal, re-fax or send electronically.   Levothyroxine Lantus Solostar  Call back is [phone] and fax is [phone]  Ples SpecterAlisa Brake, RN [facility] RN)

## 2018-01-24 DIAGNOSIS — L80 Vitiligo: Secondary | ICD-10-CM | POA: Insufficient documentation

## 2018-04-18 ENCOUNTER — Other Ambulatory Visit: Payer: Self-pay

## 2018-04-18 ENCOUNTER — Ambulatory Visit (INDEPENDENT_AMBULATORY_CARE_PROVIDER_SITE_OTHER): Payer: Self-pay | Admitting: Family Medicine

## 2018-04-18 ENCOUNTER — Encounter: Payer: Self-pay | Admitting: Family Medicine

## 2018-04-18 VITALS — BP 120/82 | HR 70 | Temp 98.5°F | Wt 194.4 lb

## 2018-04-18 DIAGNOSIS — E1165 Type 2 diabetes mellitus with hyperglycemia: Secondary | ICD-10-CM

## 2018-04-18 LAB — POCT GLYCOSYLATED HEMOGLOBIN (HGB A1C): HbA1c, POC (controlled diabetic range): 13.6 % — AB (ref 0.0–7.0)

## 2018-04-18 MED ORDER — INSULIN GLARGINE 100 UNIT/ML SOLOSTAR PEN: 30.0000 [IU] | PEN_INJECTOR | Freq: Every day | SUBCUTANEOUS | 0 refills | Status: DC

## 2018-04-18 MED ORDER — INSULIN PEN NEEDLE 29G X 12.7MM MISC: 1.0000 | Freq: Every day | 11 refills | Status: DC

## 2018-04-18 MED ORDER — METFORMIN HCL 1000 MG PO TABS: 1000.0000 mg | ORAL_TABLET | Freq: Two times a day (BID) | ORAL | 3 refills | Status: DC

## 2018-04-18 MED ORDER — ATORVASTATIN CALCIUM 40 MG PO TABS: 40.0000 mg | ORAL_TABLET | Freq: Every day | ORAL | 3 refills | Status: DC

## 2018-04-18 NOTE — Patient Instructions (Signed)
Dear [name],   It was nice to see you today! I am glad you came in for your concerns. This document serves as a "wrap-up" to all that we discussed today and is listed as follows:    Diabetes  Refilling your metformin for 90 days.  This should be $10 at [facility].  Refilling your statin medication for 90 days.  Also $10 at [facility]  Made a change to a different statin for cost  Take 10 units of Lantus every night. (You have this at home and we are providing 2 pens for you)  Write down your fasting blood glucose numbers in the morning  Follow-up in 1 month  When you get your orange card, you can get medications at the [facility] department.  Please let us know when this occurs so that we can switch her pharmacy.  Please continue to update [place] on your medical finances. I would like you to come in for labs when you are covered.   Try to get a new pair of shoes that are not too small for your feet.   Please make a follow up appt with ophthalmology as soon as you are able.    Thank you for choosing [facility] for your primary care needs and stay well!   Best,   Dr. Genia Hotter Resident Physician, PGY-1 [facility] [phone]    Don't forget to sign up for MyChart for instant access to your health profile, labs, orders, upcoming appointments or to contact your provider with questions. Stop at the front desk on the way out for more information about how to sign up!

## 2018-04-18 NOTE — Progress Notes (Addendum)
SUBJECTIVE:  PCP: Melene Plan, MD Patient ID: MRN [id number]  Date of birth: 1976-01-03  HPI [name] is a 42 y.o. female who presents to clinic with chief complaint of diabetes, back pain, and purple toes.  #Diabetes, untreated, uncontrolled Patient reports that she has been diagnosed with diabetes in the past and was insulin-dependent, however due to various social and financial issues was unable to afford medication over the last 2 years.  Though she was discontinued on the losartan as she was experiencing side effects of cough.  Her medication regimen was Humalog, Lantus, Trulicity, metformin, losartan, Crestor, Januvia in 2017. Her last A1c in 2017 was 13.6.  It remains 13.6 on POCT today.  She reports that she has just started taking her metformin as of 04/11/2018 as she has been experiencing other symptoms that might be associated with her diabetes.  The symptoms are black toenails and neuropathy.  She reports that she has some diabetic medication at home including insulin.  She has not been taking a statin as previously prescribed.   Patient reports that her fasting blood sugars over the last week since starting metformin have been 276, 271, 331.  She is also noticed that she has been less thirsty after restarting metformin. She has previously been diagnosed with retinal hemorrhages at the ophthalmologist.  She has not returned to the ophthalmologist due to access. She checks her feet weekly.  Her blood pressure today is within normal limits.  #Neuropathic pain Extending from T7 dermatome anteriorly widening in the flank and narrowing to the breast. She describes a 3 inch wide spot on her back just left of her thoracic spine and below her scapula that becomes numb intermittently.  She does not believe that this is muscle pain and she feels that more so towards the surface of her skin.  She also reports that sometimes she gets a radiating shocklike pain that moves anteriorly.   This pain can be very bothersome to her keeping her from sleep at night.  She reports taking NSAIDs that help with the pain, 3 pills for 2 days and off for 1 day and continues on the cycle.  She reports that she tries not to take NSAIDs every day.  She has not tried Tylenol.  She reports that she was previously given gabapentin of an unknown dose to take 3 times a day but reports these were not helpful.  She has never had the chickenpox.  She believes that she was vaccinated.  She denies any rashes or eruptions in this area.  #Black toenails The black toenails are at the proximal nailbeds of both great toes.  She reports that these have been here for about 2 to 3 weeks.  She reports having to switch shoes a couple weeks ago for work as her shoes were left out in the rain.  She also remembers having very long toenails during this time.  She is on her feet a lot throughout the day as she works at [employer].  She reports that she checks her nails and her feet very often and saw the lesions on her toes when she took her toenail polish off.  She does not have any pain in the area.  She reports that sometimes her right great toe can feel numb.  #Social Patient is currently uninsured and does not have insurance through her employer despite working full-time as the premium still costs about 120s to $150 a month.  She reports that she will  be working with our office for financial aid and will be meeting with Marylu Lund later this week.  Review of Symptoms: See HPI  HISTORY Medications & Allergies: Reviewed with patient and updated in EMR as appropriate.   PMHx:  Patient Active Problem List   Diagnosis Date Noted  . IUD (intrauterine device) in place 03/26/2015  . Carpal tunnel syndrome 03/26/2015  . Obesity 06/25/2007  . Non-alcoholic fatty liver disease 06/25/2007  . Hypothyroidism 04/09/2007  . Uncontrolled type 2 diabetes mellitus with hyperglycemia, without long-term current use of insulin (HCC)  04/09/2007  . Hyperlipidemia 04/09/2007   SHx  reports that she has never smoked. She has never used smokeless tobacco. She reports that she does not drink alcohol. Her drug history is not on file.  Patient has 2 children, 1 son and 1 daughter.  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  She appreciates discipline and the meditation.  She and her son will be testing further black belt in December.  She works full-time at [employer].  OBJECTIVE:  BP 120/82   Pulse 70   Temp 98.5 F (36.9 C) (Oral)   Wt 194 lb 6.4 oz (88.2 kg)   SpO2 97%   BMI 35.56 kg/m   Physical Exam:  Gen: NAD, alert, non-toxic, well-appearing, sitting comfortably  Skin: Warm and dry. No obvious rashes, lesions, or trauma. HEENT: NCAT. PERRLA. No conjunctival pallor or injection. No scleral icterus or injection.  MMM.  CV: RRR.  Normal S1-S2.  RP & DPs 2+ bilaterally. No BLEE. Resp: CTAB.  No wheezing, rales, abnormal lung sounds.  No increased WOB Abd: NTND on palpation to all 4 quadrants.  Positive bowel sounds. Psych: Cooperative with exam. Pleasant. Makes eye contact. Speech normal. Extremities: Moves all extremities spontaneously  Neuro: CN II-XII grossly intact. No FNDs.  Back: spine symmetric w/o abnormal curvature. No TTP C/T/L spinous processes.  Patient reports decreased sensation at 3 inch wide space just left lateral to thoracic spine and inferior to left medial border of scapula.   Diabetic foot exam: Upon inspection there are no lesions, rash, macerated areas between toes, bony deformities, bunions. DPs are 2+ bilaterally.  There is no loss of sensation to monofilament touch or pinprick sensation bilaterally at sites at high risk for ulceration. Patient has a normal gait and joint mobility.  Patient has preserved great toe proprioception bilaterally.  Pertinent Labs & Imaging:  Reviewed in chart  A1c: 2017 -13.6.  Today - 13.6.   ASSESSMENT & PLAN:    Uncontrolled type 2 diabetes mellitus with hyperglycemia, without long-term current use of insulin (HCC) Patient is motivated to get her diabetes under control.  Today, due to financial limitations, only A1c was performed.  Patient is meeting with Marylu Lund later this week for other financial options.  Until then, affordable medications are started.  Good Rx list for 90-day prescriptions of metformin 1000 mg twice daily and atorvastatin 40 mg at [facility].  Coupon supplied.  Patient was given 2 Lantus pens in clinic today.  Pen needles ordered.  Patient also reports having Lantus and Humalog at home.  Patient does not need new testing strips currently.  She does not know the maker of her glucometer.  Follow-up in 1 month to check daily fasting blood sugars.  And to likely uptitrate medication.  We will follow patient's financial status and provide any medication changes as needed.  Future lab tests  CMP for kidney function and  liver function as history is significant for fatty liver disease.  Lipid panel.  CBC with differential to check for anemia as patient reports being fatigued on a daily basis.  TSH for her thyroid disease.  Patient will also need follow-up with the ophthalmologist as she has a history of retinal hemorrhages    Health Maintenance Due  Topic Date Due  . URINE MICROALBUMIN  09/02/2016  . OPHTHALMOLOGY EXAM  03/21/2017  . INFLUENZA VACCINE  02/01/2018     Genia Hotter, M.D. [facility]  PGY -1 04/18/2018, 3:35 PM    ============================================== ADDENDUM -Called patient to follow-up about medication adherence.  Patient reports that she was able to receive her medications and overall feels good.  She reports that she still has some tiredness.  Patient is encouraged to follow-up with PCP with any concerns about health or medications.  Patient has no other complaints at this time.

## 2018-04-18 NOTE — Assessment & Plan Note (Addendum)
Patient is motivated to get her diabetes under control.  Today, due to financial limitations, only A1c was performed.  Patient is meeting with Marylu Lund later this week for other financial options.  Until then, affordable medications are started.  Good Rx list for 90-day prescriptions of metformin 1000 mg twice daily and atorvastatin 40 mg at [facility].  Coupon supplied.  Patient was given 2 Lantus pens in clinic today.  Pen needles ordered.  Patient also reports having Lantus and Humalog at home.  Patient does not need new testing strips currently.  She does not know the maker of her glucometer.  Follow-up in 1 month to check daily fasting blood sugars.  And to likely uptitrate medication.  We will follow patient's financial status and provide any medication changes as needed.  Future lab tests  CMP and urine microalbumin for kidney function and liver function as history is significant for fatty liver disease.  Lipid panel.  CBC with differential to check for anemia as patient reports being fatigued on a daily basis.  TSH for her thyroid disease.   Patient will need flu shot.  Patient will also need follow-up with the ophthalmologist as she has a history of retinal hemorrhages

## 2018-05-23 ENCOUNTER — Ambulatory Visit: Payer: Self-pay | Admitting: Family Medicine

## 2019-01-16 ENCOUNTER — Other Ambulatory Visit: Payer: Self-pay

## 2019-01-16 ENCOUNTER — Ambulatory Visit: Payer: Self-pay | Admitting: Internal Medicine

## 2019-01-16 ENCOUNTER — Encounter: Payer: Self-pay | Admitting: Internal Medicine

## 2019-01-16 VITALS — BP 126/80 | HR 78 | Resp 12 | Ht 62.25 in | Wt 192.0 lb

## 2019-01-16 DIAGNOSIS — E119 Type 2 diabetes mellitus without complications: Secondary | ICD-10-CM

## 2019-01-16 DIAGNOSIS — E1165 Type 2 diabetes mellitus with hyperglycemia: Secondary | ICD-10-CM

## 2019-01-16 DIAGNOSIS — L80 Vitiligo: Secondary | ICD-10-CM

## 2019-01-16 DIAGNOSIS — E039 Hypothyroidism, unspecified: Secondary | ICD-10-CM

## 2019-01-16 DIAGNOSIS — Z794 Long term (current) use of insulin: Secondary | ICD-10-CM

## 2019-01-16 DIAGNOSIS — E782 Mixed hyperlipidemia: Secondary | ICD-10-CM

## 2019-01-16 LAB — GLUCOSE, POCT (MANUAL RESULT ENTRY): POC Glucose: 366 mg/dl — AB (ref 70–99)

## 2019-01-16 MED ORDER — METFORMIN HCL 1000 MG PO TABS: 1000.0000 mg | ORAL_TABLET | Freq: Two times a day (BID) | ORAL | 11 refills | Status: DC

## 2019-01-16 MED ORDER — LEVOTHYROXINE SODIUM 150 MCG PO TABS: 150.0000 ug | ORAL_TABLET | Freq: Every day | ORAL | 11 refills | Status: DC

## 2019-01-16 MED ORDER — GLIPIZIDE 10 MG PO TABS: 10.0000 mg | ORAL_TABLET | Freq: Two times a day (BID) | ORAL | 11 refills | Status: DC

## 2019-01-16 NOTE — Progress Notes (Signed)
Subjective:    Patient ID: [name], female   DOB: [birth year], 43 y.o.   MRN: [id number]   HPI   1.  DM:  Has not taken care of her DM for 2 years.  Thinks she was diagnosed in 2005.   Started on Metformin and increased to 1000 mg twice daily.  Did not really alter her diet, which she admits was high in carbs.   4 years ago, she was initiated on Lantus and Humalog.   Was using 30 units in the morning and 30 units at bedtime. Humalog dosing was 10 units twice daily before meals.   Last A1C was 13.6% 04/2018. Admits her diet could be better, but she doesn't say why she hasn't worked on that. Has associated hyperlipidemia.   No history of microalbuminuria. Sounds like she was on an ACE I and developed a cough.    2.  Vitiligo:  Started 2003.  Mainly face, but some on fingers.   3.  Hypothyroidism:  No hormone replacement for 2 years.  Was on 150 mcg of Levothyroxine.     No outpatient medications have been marked as taking for the 01/16/19 encounter (Office Visit) with Julieanne MansonMulberry, Clair Alfieri, MD.   Allergies  Allergen Reactions  . Ace Inhibitors Cough    Per history and chart review 2012.   Past Medical History:  Diagnosis Date  . Diabetes mellitus   . Hyperlipidemia   . Thyroid disease    No past surgical history on file.  No family history on file.  Social History   Socioeconomic History  . Marital status: Single    Spouse name: Not on file  . Number of children: Not on file  . Years of education: Not on file  . Highest education level: Not on file  Occupational History  . Not on file  Social Needs  . Financial resource strain: Not on file  . Food insecurity    Worry: Not on file    Inability: Not on file  . Transportation needs    Medical: Not on file    Non-medical: Not on file  Tobacco Use  . Smoking status: Never Smoker  . Smokeless tobacco: Never Used  Substance and Sexual Activity  . Alcohol use: No  . Drug use: Not on file  .  Sexual activity: Yes    Comment: implanon stop date March 2014  Lifestyle  . Physical activity    Days per week: Not on file    Minutes per session: Not on file  . Stress: Not on file  Relationships  . Social Musicianconnections    Talks on phone: Not on file    Gets together: Not on file    Attends religious service: Not on file    Active member of club or organization: Not on file    Attends meetings of clubs or organizations: Not on file    Relationship status: Not on file  . Intimate partner violence    Fear of current or ex partner: Not on file    Emotionally abused: Not on file    Physically abused: Not on file    Forced sexual activity: Not on file  Other Topics Concern  . Not on file  Social History Narrative   Patient has 2 children, 1 son and 1 daughter.  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  She appreciates discipline and the meditation.  She and her son will  be testing further black belt in December.  She works full-time at [employer].     Review of Systems    Objective:   BP 126/80 (BP Location: Left Arm, Patient Position: Sitting, Cuff Size: Normal)   Pulse 78   Resp 12   Ht 5' 2.25" (1.581 m)   Wt 192 lb (87.1 kg)   LMP 12/27/2018   BMI 34.84 kg/m   Physical Exam  NAD HEENT:  PERRL, EOMI, TMs pearly gray, throat without injection.   Neck:  Supple No adenopathy, no thyromegaly Chest:  CTA CV:  RRR with normal S1 and S2, No S3, S4 or murmur.  No carotid bruits.  Carotid, radial and DP pulses normal and equal LE:  No edema Abd:  S, NT, No HSM or mass, + BS Skin:  Loss of skin pigment on face and fingers  Assessment & Plan  1.  DM:  As has been off insulin for some time, check insulin level to determine if absolutely needs insulin.   Glipizide 10 mg and Metformin 1000 mg twice daily with meals for now. A1C baseline.  CBC, CMP To get started in making goals with diet and daily physical activity. Flu vaccine in  Sept/Oct  2.  Hypothyroidism:  TSH.  Restart Levothyroxine.  3.  Vitiligo:  Follow for now.   4.  Hyperlipidemia:  Evaluate once DM better controlled.

## 2019-01-16 NOTE — Patient Instructions (Signed)
Tome un vaso de agua antes de cada comida Tome un minimo de 6 a 8 vasos de agua diarios Coma tres veces al dia Coma una proteina y Ardelia Mems grasa saludable con comida.  (huevos, pescado, pollo, pavo, y limite carnes rojas Coma 5 porciones diarias de legumbres.  Mezcle los colores Coma 2 porciones diarias de frutas con cascara cuando sea comestible Use platos pequeos Suelte su tenedor o cuchara despues de cada mordida hata que se mastique y se trague Come en la mesa con amigos o familiares por lo menos una vez al dia [place] la televisin y aparatos electrnicos durante la comida  Su objetivo debe ser perder una libra por semana  Estudios recientes indican que las personas quienes consumen todos de sus calorias durante 12 horas se bajan de pesocon Mas eficiencia.  Por ejemplo, si Usted come su primera comida a las 7:00 a.m., su comida final del dia se debe completar antes de las 7:00 p.m.  Make daily goals for physical activity and how you eat. Your entire family should eat the same way.

## 2019-01-18 LAB — COMPREHENSIVE METABOLIC PANEL
ALT: 28 IU/L (ref 0–32)
AST: 21 IU/L (ref 0–40)
Albumin/Globulin Ratio: 1.7 (ref 1.2–2.2)
Albumin: 4.3 g/dL (ref 3.8–4.8)
Alkaline Phosphatase: 119 IU/L — ABNORMAL HIGH (ref 39–117)
BUN/Creatinine Ratio: 15 (ref 9–23)
BUN: 12 mg/dL (ref 6–24)
Bilirubin Total: 0.5 mg/dL (ref 0.0–1.2)
CO2: 24 mmol/L (ref 20–29)
Calcium: 9.3 mg/dL (ref 8.7–10.2)
Chloride: 99 mmol/L (ref 96–106)
Creatinine, Ser: 0.8 mg/dL (ref 0.57–1.00)
GFR calc Af Amer: 106 mL/min/{1.73_m2} (ref >59)
GFR calc non Af Amer: 92 mL/min/{1.73_m2} (ref >59)
Globulin, Total: 2.6 g/dL (ref 1.5–4.5)
Glucose: 351 mg/dL — ABNORMAL HIGH (ref 65–99)
Potassium: 4.2 mmol/L (ref 3.5–5.2)
Sodium: 138 mmol/L (ref 134–144)
Total Protein: 6.9 g/dL (ref 6.0–8.5)

## 2019-01-18 LAB — INSULIN, RANDOM: INSULIN: 3.6 u[IU]/mL (ref 2.6–24.9)

## 2019-01-18 LAB — CBC WITH DIFFERENTIAL/PLATELET
Basophils Absolute: 0 10*3/uL (ref 0.0–0.2)
Basos: 1 %
EOS (ABSOLUTE): 0.1 10*3/uL (ref 0.0–0.4)
Eos: 2 %
Hematocrit: 45.2 % (ref 34.0–46.6)
Hemoglobin: 15 g/dL (ref 11.1–15.9)
Immature Grans (Abs): 0 10*3/uL (ref 0.0–0.1)
Immature Granulocytes: 0 %
Lymphocytes Absolute: 2.3 10*3/uL (ref 0.7–3.1)
Lymphs: 36 %
MCH: 31.4 pg (ref 26.6–33.0)
MCHC: 33.2 g/dL (ref 31.5–35.7)
MCV: 95 fL (ref 79–97)
Monocytes Absolute: 0.3 10*3/uL (ref 0.1–0.9)
Monocytes: 4 %
Neutrophils Absolute: 3.5 10*3/uL (ref 1.4–7.0)
Neutrophils: 57 %
Platelets: 181 10*3/uL (ref 150–450)
RBC: 4.78 x10E6/uL (ref 3.77–5.28)
RDW: 12.9 % (ref 11.7–15.4)
WBC: 6.2 10*3/uL (ref 3.4–10.8)

## 2019-01-18 LAB — TSH: TSH: 42.35 u[IU]/mL — ABNORMAL HIGH (ref 0.450–4.500)

## 2019-01-18 LAB — HGB A1C W/O EAG: Hgb A1c MFr Bld: 15 % — ABNORMAL HIGH (ref 4.8–5.6)

## 2019-01-21 ENCOUNTER — Encounter: Payer: Self-pay | Admitting: Internal Medicine

## 2019-03-15 ENCOUNTER — Other Ambulatory Visit: Payer: Self-pay

## 2019-03-15 DIAGNOSIS — E039 Hypothyroidism, unspecified: Secondary | ICD-10-CM

## 2019-03-16 LAB — TSH: TSH: 1.18 u[IU]/mL (ref 0.450–4.500)

## 2019-03-19 ENCOUNTER — Ambulatory Visit: Payer: Self-pay | Admitting: Internal Medicine

## 2019-03-19 ENCOUNTER — Other Ambulatory Visit: Payer: Self-pay

## 2019-03-19 ENCOUNTER — Encounter: Payer: Self-pay | Admitting: Internal Medicine

## 2019-03-19 VITALS — BP 138/96 | HR 76 | Resp 12 | Ht 62.25 in | Wt 194.0 lb

## 2019-03-19 DIAGNOSIS — E1165 Type 2 diabetes mellitus with hyperglycemia: Secondary | ICD-10-CM

## 2019-03-19 DIAGNOSIS — E669 Obesity, unspecified: Secondary | ICD-10-CM

## 2019-03-19 DIAGNOSIS — R030 Elevated blood-pressure reading, without diagnosis of hypertension: Secondary | ICD-10-CM

## 2019-03-19 DIAGNOSIS — Z6832 Body mass index (BMI) 32.0-32.9, adult: Secondary | ICD-10-CM

## 2019-03-19 DIAGNOSIS — E1142 Type 2 diabetes mellitus with diabetic polyneuropathy: Secondary | ICD-10-CM

## 2019-03-19 DIAGNOSIS — E039 Hypothyroidism, unspecified: Secondary | ICD-10-CM

## 2019-03-19 MED ORDER — AGAMATRIX ULTRA-THIN LANCETS MISC: 11 refills | Status: DC

## 2019-03-19 MED ORDER — GABAPENTIN 100 MG PO CAPS: ORAL_CAPSULE | ORAL | 4 refills | Status: DC

## 2019-03-19 MED ORDER — AGAMATRIX PRESTO W/DEVICE KIT: PACK | 0 refills | Status: DC

## 2019-03-19 MED ORDER — TEA TREE OIL OIL: TOPICAL_OIL | 0 refills | Status: DC

## 2019-03-19 MED ORDER — AGAMATRIX PRESTO TEST VI STRP: ORAL_STRIP | 11 refills | Status: DC

## 2019-03-19 NOTE — Patient Instructions (Addendum)
Gabapentin 100 mg capsula: 1 capsula en la noche.  En 3 dias, sube a 2 capsulas en la noche, en 3 mas dias, sube a 3 capsulas en la noche. En 3 dias, anade 1 capsula en la manana y sigue 3 capsulas en la noche En 3 dias, sube a 2 capsulas en la manana y 3 capsulas en la noche En 3 dias, sube a 3 capsulas en la manana y 3 capsulas en la noche En 3 dias sigue 3 capsulas en la manana y la noche y sigue 1 capsula medio dia En 3 dias, sube 2 capsulas el medio dia y sigue 3 capsulas en la manana y en la noche En 3 dias sube 3 capsulas el medio dia, en la manana y en la noche  Flu clinics on Sept 17 and Oct 15 8:30 a.m. to 11:30 a.m.   Voting registration with the Sept 17 clinic also  Tome un vaso de agua antes de [place] un minimo de 6 a 8 vasos de agua diarios Coma tres veces al dia Coma Ardelia Mems proteina y Ardelia Mems grasa saludable con comida.  (huevos, pescado, pollo, pavo, y limite carnes rojas Coma 5 porciones diarias de legumbres.  Mezcle los colores Coma 2 porciones diarias de frutas con cascara cuando sea comestible Use platos pequeos Suelte su tenedor o cuchara despues de cada mordida hata que se mastique y se trague Come en la mesa con amigos o familiares por lo menos una vez al dia [place] la televisin y aparatos electrnicos durante la comida  Su objetivo debe ser perder una libra por semana  Estudios recientes indican que las personas quienes consumen todos de sus calorias durante 12 horas se bajan de pesocon Mas eficiencia.  Por ejemplo, si Usted come su primera comida a las 7:00 a.m., su comida final del dia se debe completar antes de las 7:00 p.m.

## 2019-03-19 NOTE — Progress Notes (Signed)
    Subjective:    Patient ID: [name], female   DOB: 1976-04-03, 43 y.o.   MRN: [id number]   HPI   1.  DM:  Blood sugars in am fasting 200-250.  Maybe a bit lower 2 hours postprandial in the evening after dinner. She is taking Metformin 1000 mg twice daily and Glipizide 10 mg twice daily. Feels she can make changes   2.  History of back pain for which she was placed on Gabapentin some years ago that did not help.   Now she has burning and numbness of both feet.  Has been a problem for 1 year.  Back pain no longer an issue.  3.  Hypothyroidism:  Recently TSH was in normal range with supplementation of hormone.  She still feels tired, but works 5 days weekly and has 2 young children at home with virtual school.  Current Meds  Medication Sig  . glipiZIDE (GLUCOTROL) 10 MG tablet Take 1 tablet (10 mg total) by mouth 2 (two) times daily before a meal.  . levonorgestrel (MIRENA) 20 MCG/24HR IUD 1 each by Intrauterine route once. Placed 07/2017  . levothyroxine (SYNTHROID) 150 MCG tablet Take 1 tablet (150 mcg total) by mouth daily.  . metFORMIN (GLUCOPHAGE) 1000 MG tablet Take 1 tablet (1,000 mg total) by mouth 2 (two) times daily with a meal.   Allergies  Allergen Reactions  . Ace Inhibitors Cough    Per history and chart review 2012.     Review of Systems    Objective:   BP (!) 138/96 (BP Location: Left Arm, Patient Position: Sitting, Cuff Size: Normal)   Pulse 76   Resp 12   Ht 5' 2.25" (1.581 m)   Wt 194 lb (88 kg)   LMP 03/17/2019   BMI 35.20 kg/m   Physical Exam  NAD HEENT:  PERRL, EOMI Neck: Supple, No adenopathy Chest:  CTA CV:  RRR without murmur or rub.  Radial and DP pulses normal and equal Feet with mild flaking   Diabetic foot exam was performed with the following findings:   No deformities, ulcerations, or other skin breakdown Normal sensation of 10g monofilament Intact posterior tibialis and dorsalis pedis pulses Could not feel  monofilament on plantar aspect of right heel.  Not clear if due to callus formation there       Assessment & Plan  1.  DM:  Not at goal.  Needs to work on diet and physical activity.  Would like to hold on adding Lantus back.   2.  Elevated BP:  Would like to work on diet and physical activity before considering ARB.  Will check urine microalbumin/crea in October with other labs as well.  3.  Peripheral neuropathy:  Start Gabapentin and titrate to 300 mg tid.    A1C, FLP, Urine microablumin/crea mid to late October.

## 2019-04-19 ENCOUNTER — Other Ambulatory Visit: Payer: Self-pay

## 2019-04-19 ENCOUNTER — Other Ambulatory Visit (INDEPENDENT_AMBULATORY_CARE_PROVIDER_SITE_OTHER): Payer: Self-pay

## 2019-04-19 DIAGNOSIS — E782 Mixed hyperlipidemia: Secondary | ICD-10-CM

## 2019-04-19 DIAGNOSIS — E1165 Type 2 diabetes mellitus with hyperglycemia: Secondary | ICD-10-CM

## 2019-04-20 LAB — LIPID PANEL W/O CHOL/HDL RATIO
Cholesterol, Total: 228 mg/dL — ABNORMAL HIGH (ref 100–199)
HDL: 49 mg/dL (ref >39)
LDL Chol Calc (NIH): 157 mg/dL — ABNORMAL HIGH (ref 0–99)
Triglycerides: 121 mg/dL (ref 0–149)
VLDL Cholesterol Cal: 22 mg/dL (ref 5–40)

## 2019-04-20 LAB — MICROALBUMIN / CREATININE URINE RATIO
Creatinine, Urine: 53.1 mg/dL
Microalb/Creat Ratio: <6 mg/g creat (ref 0–29)
Microalbumin, Urine: <3 ug/mL

## 2019-04-20 LAB — HGB A1C W/O EAG: Hgb A1c MFr Bld: 9.5 % — ABNORMAL HIGH (ref 4.8–5.6)

## 2019-06-17 ENCOUNTER — Telehealth: Payer: Self-pay | Admitting: Internal Medicine

## 2019-06-17 NOTE — Telephone Encounter (Signed)
Discussed with Dr. Amil Amen - ok to authorized Need to schedule pt. For TSH lab 4-6 weeks ... Lab scheduled for 07/28/2018 at 11:00 AM  Spoke to [facility] and states they are now working with two different manufactures for Levothyroxine. The original one was discontinued.  Patient will pay the same price and is the same strength as original one. Same price $4.00 . Authorized

## 2019-06-17 NOTE — Telephone Encounter (Signed)
Huy,  Pharmacist at [facility] called requesting authorization on levothyroxine (SYNTHROID) 150 MCG tablet new manufacture company. Huy stated medication has  same strength but the new manufactures to choose  are Alvogen and [employer] . Pharmacist can be contacted at [phone]  Please advise.

## 2019-06-18 ENCOUNTER — Telehealth (INDEPENDENT_AMBULATORY_CARE_PROVIDER_SITE_OTHER): Payer: Self-pay | Admitting: Internal Medicine

## 2019-06-18 ENCOUNTER — Other Ambulatory Visit: Payer: Self-pay

## 2019-06-18 DIAGNOSIS — E669 Obesity, unspecified: Secondary | ICD-10-CM

## 2019-06-18 DIAGNOSIS — E1142 Type 2 diabetes mellitus with diabetic polyneuropathy: Secondary | ICD-10-CM | POA: Insufficient documentation

## 2019-06-18 DIAGNOSIS — E782 Mixed hyperlipidemia: Secondary | ICD-10-CM

## 2019-06-18 DIAGNOSIS — R030 Elevated blood-pressure reading, without diagnosis of hypertension: Secondary | ICD-10-CM | POA: Insufficient documentation

## 2019-06-18 DIAGNOSIS — E039 Hypothyroidism, unspecified: Secondary | ICD-10-CM

## 2019-06-18 DIAGNOSIS — E1165 Type 2 diabetes mellitus with hyperglycemia: Secondary | ICD-10-CM

## 2019-06-18 DIAGNOSIS — Z6832 Body mass index (BMI) 32.0-32.9, adult: Secondary | ICD-10-CM

## 2019-06-18 DIAGNOSIS — G5603 Carpal tunnel syndrome, bilateral upper limbs: Secondary | ICD-10-CM

## 2019-06-18 NOTE — Patient Instructions (Signed)
[facility] [phone] [address] [place], [place]  [id number] Eye exam and 2 pair of eyeglasses for $69.95 

## 2019-06-18 NOTE — Progress Notes (Signed)
Subjective:    Patient ID: [name], female   DOB: [birth year], 43 y.o.   MRN: [id number]   HPI   Virtual Visit with [employer] via another phone line as could not connect with audio No interpreter needed  1.  Diabetic Peripheral Neuropathy:  Despite taking Gabapentin 300 mg 3 times daily, she is having lots of pain.  States has pins and needles in feet and thenar eminence of hands.  Hands can be bad at night.  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 Describes legs as a muscular type pain as if she ran a marathon the day before.  Also with the pins and needles as she gets in her hands. Good color to toes and feet.  Checks them daily.  2. DM:  A1C was better in October, down to 9.5 % from 15.0 %.  She does not eat after 6 p.m.  Breakfast is healthy.  Reduced Tortillas and very rare rice intake.   Sugars in the morning 100-120.  Generally always below 200, even in the evening. Last weight down to 182 lbs Drinking more water. She is not exercising, though states her legs don't hurt anymore with exercise than at rest. Did get [facility] --stable.   Did not get influenza vaccine this year. She adds that her A1C in October only reflected her working on diet for 1 month, though she did take oral meds prior on a regular basis.  3.  Hypertension:  Not taking meds.  Has not had bp checked and did not pick up monitor to check--was not aware. Urine microalbumin/crea negative in October  4.  Hyperlipidemia:  Cholesterol not at goal.  She is not taking Atorvastatin as she is making significant lifestyle changes and wants to see if she can get this at goal without medication.  5.  Hypothyroidism:  Her TSH was back under control since July.  Current Meds  Medication Sig  . AgaMatrix Ultra-Thin Lancets MISC Check blood glucose twice daily before meals  . Blood Glucose Monitoring Suppl (AGAMATRIX PRESTO) w/Device KIT Check blood glucose twice daily before meals  .  gabapentin (NEURONTIN) 100 MG capsule 1 cap by mouth at bedtime and titrate up by 1 cap every 3 days to max of 3 caps 3 times daily.  Marland Kitchen glipiZIDE (GLUCOTROL) 10 MG tablet Take 1 tablet (10 mg total) by mouth 2 (two) times daily before a meal.  . glucose blood (AGAMATRIX PRESTO TEST) test strip Check blood glucose twice daily before meals  . levonorgestrel (MIRENA) 20 MCG/24HR IUD 1 each by Intrauterine route once. Placed 07/2017  . levothyroxine (SYNTHROID) 150 MCG tablet Take 1 tablet (150 mcg total) by mouth daily.  . metFORMIN (GLUCOPHAGE) 1000 MG tablet Take 1 tablet (1,000 mg total) by mouth 2 (two) times daily with a meal.  . Tea Tree Oil OIL Mix with Gold Bond Foot cream and apply to thickened flaking feet at bedtime daily.   Allergies  Allergen Reactions  . Ace Inhibitors Cough    Per history and chart review 2012.     Review of Systems    Objective:   There were no vitals taken for this visit.  Physical Exam   + Tinels and Phalens of bilateral median nerves--patient performs via video and instruction.   Assessment & Plan   1.  Bilateral Carpal Tunnel syndrome:  Ibuprofen 400-600 mg twice daily with meals for 14 days. Cock up splints at bedtime nightly.  2.  Leg  and foot pain:  Not clear all of this is due to diabetic peripheral neuropathy.  Will evaluate in person with follow up end of January/begining of Feb.   Continue Gabapentin for now--if is DPN--consider switch to Lyrica.  3.  DM:  Much improved control in October with control of hypothyroidism, diet and back on meds.   Recheck A1C end of Jan with fasting labs  4.  Elevated BP without microalbuminuria:  Recheck in Jan/feb  5.  Hyperlipidemia:  FLP in Jan again.    6.  Obesity:  Has lost substantial weight.  CPM  And add physical activity daily as does not seem to worsen leg pain.    

## 2019-06-18 NOTE — Progress Notes (Signed)
Appointments scheduled

## 2019-07-29 ENCOUNTER — Other Ambulatory Visit: Payer: Self-pay

## 2019-07-30 ENCOUNTER — Other Ambulatory Visit (INDEPENDENT_AMBULATORY_CARE_PROVIDER_SITE_OTHER): Payer: Self-pay

## 2019-07-30 ENCOUNTER — Other Ambulatory Visit: Payer: Self-pay

## 2019-07-30 DIAGNOSIS — E1165 Type 2 diabetes mellitus with hyperglycemia: Secondary | ICD-10-CM

## 2019-07-30 DIAGNOSIS — E782 Mixed hyperlipidemia: Secondary | ICD-10-CM

## 2019-07-30 DIAGNOSIS — E039 Hypothyroidism, unspecified: Secondary | ICD-10-CM

## 2019-07-31 ENCOUNTER — Encounter: Payer: Self-pay | Admitting: Internal Medicine

## 2019-07-31 ENCOUNTER — Ambulatory Visit: Payer: Self-pay | Admitting: Internal Medicine

## 2019-07-31 VITALS — BP 124/80 | HR 64 | Resp 12 | Ht 62.25 in | Wt 190.0 lb

## 2019-07-31 DIAGNOSIS — E782 Mixed hyperlipidemia: Secondary | ICD-10-CM

## 2019-07-31 DIAGNOSIS — Z6832 Body mass index (BMI) 32.0-32.9, adult: Secondary | ICD-10-CM

## 2019-07-31 DIAGNOSIS — E1142 Type 2 diabetes mellitus with diabetic polyneuropathy: Secondary | ICD-10-CM

## 2019-07-31 DIAGNOSIS — E039 Hypothyroidism, unspecified: Secondary | ICD-10-CM

## 2019-07-31 DIAGNOSIS — E669 Obesity, unspecified: Secondary | ICD-10-CM

## 2019-07-31 DIAGNOSIS — E1165 Type 2 diabetes mellitus with hyperglycemia: Secondary | ICD-10-CM

## 2019-07-31 LAB — LIPID PANEL W/O CHOL/HDL RATIO
Cholesterol, Total: 288 mg/dL — ABNORMAL HIGH (ref 100–199)
HDL: 53 mg/dL (ref >39)
LDL Chol Calc (NIH): 211 mg/dL — ABNORMAL HIGH (ref 0–99)
Triglycerides: 132 mg/dL (ref 0–149)
VLDL Cholesterol Cal: 24 mg/dL (ref 5–40)

## 2019-07-31 LAB — HGB A1C W/O EAG: Hgb A1c MFr Bld: 11 % — ABNORMAL HIGH (ref 4.8–5.6)

## 2019-07-31 LAB — TSH: TSH: 2.4 u[IU]/mL (ref 0.450–4.500)

## 2019-07-31 MED ORDER — AGAMATRIX PRESTO W/DEVICE KIT: PACK | 0 refills | Status: DC

## 2019-07-31 MED ORDER — AGAMATRIX ULTRA-THIN LANCETS MISC: 11 refills | Status: DC

## 2019-07-31 MED ORDER — AGAMATRIX PRESTO TEST VI STRP: ORAL_STRIP | 11 refills | Status: DC

## 2019-07-31 MED ORDER — GABAPENTIN 100 MG PO CAPS: ORAL_CAPSULE | ORAL | 4 refills | Status: DC

## 2019-07-31 NOTE — Progress Notes (Signed)
Subjective:    Patient ID: [name], female   DOB: Mar 31, 1976, 44 y.o.   MRN: [id number]   HPI   1.  DM:  A1C way back up to 11.0.  In the morning, she states her sugars are in low to mid 100s, but not checking later in day.   Was on insulin previously, but chose to try oral meds and make lifestyle changes when established to see if could control that way  2.  Hyperlipidemia:  Discussed this is way up as well.  She has been on Atorvastatin in past, but was not taking when established in July.  She has been wanting to work on lifestyle and not take medication.  Lipid Panel     Component Value Date/Time   CHOL 288 (H) 07/30/2019 0936   TRIG 132 07/30/2019 0936   HDL 53 07/30/2019 0936   CHOLHDL 3.6 09/03/2015 1619   VLDL 43 (H) 09/03/2015 1619   LDLCALC 211 (H) 07/30/2019 0936   LDLDIRECT 125 (H) 09/08/2008 2046   LABVLDL 24 07/30/2019 0936     3.  Burning in legs from knees to feet.  Also with pins and needles in her feet.  Wakes her from sleep.  Has to rub to improve.  Mainly at night when she has symptoms. Taking Gabapentin 300 mg 3 times daily since last visit mid September when A1C was in 9.5% range.    4.  Hypothyroidism:  Discussed TSH at good level currently.    Current Meds  Medication Sig  . AgaMatrix Ultra-Thin Lancets MISC Check blood glucose twice daily before meals  . Blood Glucose Monitoring Suppl (AGAMATRIX PRESTO) w/Device KIT Check blood glucose twice daily before meals  . gabapentin (NEURONTIN) 100 MG capsule 1 cap by mouth at bedtime and titrate up by 1 cap every 3 days to max of 3 caps 3 times daily.  Marland Kitchen glipiZIDE (GLUCOTROL) 10 MG tablet Take 1 tablet (10 mg total) by mouth 2 (two) times daily before a meal.  . glucose blood (AGAMATRIX PRESTO TEST) test strip Check blood glucose twice daily before meals  . levonorgestrel (MIRENA) 20 MCG/24HR IUD 1 each by Intrauterine route once. Placed 07/2017  . levothyroxine (SYNTHROID) 150 MCG tablet Take  1 tablet (150 mcg total) by mouth daily.  . metFORMIN (GLUCOPHAGE) 1000 MG tablet Take 1 tablet (1,000 mg total) by mouth 2 (two) times daily with a meal.  . Tea Tree Oil OIL Mix with Gold Bond Foot cream and apply to thickened flaking feet at bedtime daily.   Allergies  Allergen Reactions  . Ace Inhibitors Cough    Per history and chart review 2012.     Review of Systems    Objective:   BP 124/80 (BP Location: Left Arm, Patient Position: Sitting, Cuff Size: Normal)   Pulse 64   Resp 12   Ht 5' 2.25" (1.581 m)   Wt 190 lb (86.2 kg)   LMP 07/29/2019   BMI 34.47 kg/m   Physical Exam  NAD Lungs:  CTA CV:  RRR without murmur or rub.  Radial and DP pulses normal and equal.   Abd:  S, NT, No HSM or mass, + BS LE:  No edema. Feet:  No lesions.    Assessment & Plan  1.  DM/obesity:  Would like 4 more months to work on diet and physical activity and get her health under control.  Discussed making small goals to improve likelihood of success. Sent glucose monitoring equipment  order to [facility] again so she can see what effect changes in lifestyle has on her blood glucose.  2.  Hyperlipidemia:  As above.  If no improvement, will need to start statin.  3.  Diabetic peripheral neuropathy:  Switch to 100 mg Gabapentin capsules and titrate up to 600 mg at bedtime.  Maintain twice daytime dosing at 300 mg.  4.  Hypothyroidism:  Adequately replaced  Spent and hour face to face and with interpretation

## 2019-11-28 ENCOUNTER — Other Ambulatory Visit: Payer: Self-pay

## 2019-11-28 DIAGNOSIS — E1165 Type 2 diabetes mellitus with hyperglycemia: Secondary | ICD-10-CM

## 2019-11-28 DIAGNOSIS — E782 Mixed hyperlipidemia: Secondary | ICD-10-CM

## 2019-11-28 DIAGNOSIS — K760 Fatty (change of) liver, not elsewhere classified: Secondary | ICD-10-CM

## 2019-11-29 LAB — LIPID PANEL W/O CHOL/HDL RATIO
Cholesterol, Total: 279 mg/dL — ABNORMAL HIGH (ref 100–199)
HDL: 51 mg/dL (ref >39)
LDL Chol Calc (NIH): 200 mg/dL — ABNORMAL HIGH (ref 0–99)
Triglycerides: 153 mg/dL — ABNORMAL HIGH (ref 0–149)
VLDL Cholesterol Cal: 28 mg/dL (ref 5–40)

## 2019-11-29 LAB — HEPATIC FUNCTION PANEL
ALT: 23 IU/L (ref 0–32)
AST: 17 IU/L (ref 0–40)
Albumin: 3.9 g/dL (ref 3.8–4.8)
Alkaline Phosphatase: 112 IU/L (ref 48–121)
Bilirubin Total: 0.4 mg/dL (ref 0.0–1.2)
Bilirubin, Direct: 0.12 mg/dL (ref 0.00–0.40)
Total Protein: 6.1 g/dL (ref 6.0–8.5)

## 2019-11-29 LAB — HGB A1C W/O EAG: Hgb A1c MFr Bld: 11.6 % — ABNORMAL HIGH (ref 4.8–5.6)

## 2019-12-03 ENCOUNTER — Ambulatory Visit: Payer: Self-pay | Admitting: Internal Medicine

## 2019-12-04 ENCOUNTER — Encounter: Payer: Self-pay | Admitting: Internal Medicine

## 2019-12-04 ENCOUNTER — Other Ambulatory Visit: Payer: Self-pay

## 2019-12-04 ENCOUNTER — Ambulatory Visit: Payer: Self-pay | Admitting: Internal Medicine

## 2019-12-04 VITALS — BP 122/78 | HR 72 | Resp 12 | Ht 62.25 in | Wt 194.0 lb

## 2019-12-04 DIAGNOSIS — E669 Obesity, unspecified: Secondary | ICD-10-CM

## 2019-12-04 DIAGNOSIS — B3731 Acute candidiasis of vulva and vagina: Secondary | ICD-10-CM

## 2019-12-04 DIAGNOSIS — B373 Candidiasis of vulva and vagina: Secondary | ICD-10-CM

## 2019-12-04 DIAGNOSIS — Z6832 Body mass index (BMI) 32.0-32.9, adult: Secondary | ICD-10-CM

## 2019-12-04 DIAGNOSIS — E1165 Type 2 diabetes mellitus with hyperglycemia: Secondary | ICD-10-CM

## 2019-12-04 DIAGNOSIS — E782 Mixed hyperlipidemia: Secondary | ICD-10-CM

## 2019-12-04 MED ORDER — LANTUS SOLOSTAR 100 UNIT/ML ~~LOC~~ SOPN: PEN_INJECTOR | SUBCUTANEOUS | 3 refills | Status: DC

## 2019-12-04 MED ORDER — ATORVASTATIN CALCIUM 40 MG PO TABS: 40.0000 mg | ORAL_TABLET | Freq: Every day | ORAL | 11 refills | Status: DC

## 2019-12-04 MED ORDER — INSULIN PEN NEEDLE 31G X 6 MM MISC: 3 refills | Status: AC

## 2019-12-04 NOTE — Patient Instructions (Signed)

## 2019-12-04 NOTE — Progress Notes (Signed)
Subjective:    Patient ID: [name], female   DOB: 1975-08-16, 44 y.o.   MRN: [id number]   HPI   Interpreted  1.  DM:  A1C up to 11.6%.  States has been taking medication 100%.   Since pandemic, working a lot of overtime.  She is tired when she gets home and describes eating poorly because of it.  Little exercise.  Often eats late as well and notes her sugars higher when she does so.   Has two kids and tries to spend time with them.  Ages 44 and 43 yo.  Her 54 yo son is gaining weight. Discussed having unhealthy food around the house.  First meal:  8 a.m.:  2 eggs, boiled with an [place] Muffin and cup of tea with no sugar.  Or oatmeal with water with 2 creamers.  May eat an apple.   Lunch:  3 p.m.  Home to eat--grilled chicken with oil.  Tortillas--6 corn tortillas.   6 p.m.:  Just 2 tortillas or cookies.    2.  Hyperlipidemia:  Cholesterol remains quite high. Lipid Panel     Component Value Date/Time   CHOL 288 (H) 01/17/2020 0931   TRIG 185 (H) 01/17/2020 0931   HDL 45 01/17/2020 0931   CHOLHDL 3.6 09/03/2015 1619   VLDL 43 (H) 09/03/2015 1619   LDLCALC 208 (H) 01/17/2020 0931   LDLDIRECT 125 (H) 09/08/2008 2046   LABVLDL 35 01/17/2020 0931   3. Recurrent vaginal yeast infections:  White discharge with itching.  Topical OTC antifungals help a little bit, but not able to clear for prolonged period of time.  Discussed how poorly controlled DM often is reason for this  Current Meds  Medication Sig  . AgaMatrix Ultra-Thin Lancets MISC Check blood sugar before meal twice daily  . Blood Glucose Monitoring Suppl (AGAMATRIX PRESTO) w/Device KIT Check blood sugar twice daily before meals  . gabapentin (NEURONTIN) 100 MG capsule 3 caps by mouth morning and afternoon and 4 to 6 caps by mouth at bedtime  . glipiZIDE (GLUCOTROL) 10 MG tablet Take 1 tablet (10 mg total) by mouth 2 (two) times daily before a meal.  . glucose blood (AGAMATRIX PRESTO TEST) test strip Check  blood sugar twice daily before meals  . levonorgestrel (MIRENA) 20 MCG/24HR IUD 1 each by Intrauterine route once. Placed 07/2017  . levothyroxine (SYNTHROID) 150 MCG tablet Take 1 tablet (150 mcg total) by mouth daily.  . metFORMIN (GLUCOPHAGE) 1000 MG tablet Take 1 tablet (1,000 mg total) by mouth 2 (two) times daily with a meal.  . Tea Tree Oil OIL Mix with Gold Bond Foot cream and apply to thickened flaking feet at bedtime daily.   Allergies  Allergen Reactions  . Ace Inhibitors Cough    Per history and chart review 2012.     Review of Systems    Objective:   BP 122/78 (BP Location: Left Arm, Patient Position: Sitting, Cuff Size: Normal)   Pulse 72   Resp 12   Ht 5' 2.25" (1.581 m)   Wt 194 lb (88 kg)   LMP 11/25/2019   BMI 35.20 kg/m   Physical Exam  NAD HEENT:  PERRL, EOMI Neck:  Supple, No adenopathy, no thyromegaly Chest:  CTA CV:  RRR without murmur or rub.  Radial and DP pulses normal and equal Abd:  S, NT, No HSM or mass, + BS LE:  No edema.   Assessment & Plan  1.  DM:  Poorly controlled.  Difficult situation with work hours and limited time to spend with children/family.  Encouraged healthier foods in home to prevent same health issues in children if she cannot do this for herself.  To try and find pockets of time to do physically active activities with children. Went over what foods are high in carbs and glycemic index.  Discussed at length alternated to high glycemic index foods Add Lantus 10 units to oral meds  2.  Hyperlipidemia:  Add Atorvastatin 40 mg daily.  FLP/hepatic profile in 6 weeks.  3.  Recurrent Vaginal Yeast infections:  Discussed needs to get sugars under control first.  Using Monistat as needed.  She thought mainly due to IUD

## 2019-12-13 ENCOUNTER — Other Ambulatory Visit: Payer: Self-pay

## 2019-12-13 MED ORDER — GLIPIZIDE 10 MG PO TABS: 10.0000 mg | ORAL_TABLET | Freq: Two times a day (BID) | ORAL | 11 refills | Status: DC

## 2019-12-13 MED ORDER — LEVOTHYROXINE SODIUM 150 MCG PO TABS: 150.0000 ug | ORAL_TABLET | Freq: Every day | ORAL | 11 refills | Status: DC

## 2019-12-13 MED ORDER — METFORMIN HCL 1000 MG PO TABS: 1000.0000 mg | ORAL_TABLET | Freq: Two times a day (BID) | ORAL | 11 refills | Status: DC

## 2020-01-17 ENCOUNTER — Other Ambulatory Visit: Payer: Self-pay

## 2020-01-17 DIAGNOSIS — E782 Mixed hyperlipidemia: Secondary | ICD-10-CM

## 2020-01-17 DIAGNOSIS — Z79899 Other long term (current) drug therapy: Secondary | ICD-10-CM

## 2020-01-18 LAB — LIPID PANEL W/O CHOL/HDL RATIO
Cholesterol, Total: 288 mg/dL — ABNORMAL HIGH (ref 100–199)
HDL: 45 mg/dL (ref >39)
LDL Chol Calc (NIH): 208 mg/dL — ABNORMAL HIGH (ref 0–99)
Triglycerides: 185 mg/dL — ABNORMAL HIGH (ref 0–149)
VLDL Cholesterol Cal: 35 mg/dL (ref 5–40)

## 2020-01-18 LAB — HEPATIC FUNCTION PANEL
ALT: 24 IU/L (ref 0–32)
AST: 18 IU/L (ref 0–40)
Albumin: 4.1 g/dL (ref 3.8–4.8)
Alkaline Phosphatase: 133 IU/L — ABNORMAL HIGH (ref 48–121)
Bilirubin Total: 0.5 mg/dL (ref 0.0–1.2)
Bilirubin, Direct: 0.13 mg/dL (ref 0.00–0.40)
Total Protein: 6.6 g/dL (ref 6.0–8.5)

## 2020-02-03 ENCOUNTER — Telehealth: Payer: Self-pay | Admitting: Internal Medicine

## 2020-02-03 NOTE — Telephone Encounter (Signed)
Patient unable to get filled atorvastatin (LIPITOR) 40 MG tablet due to orange card expired; patient will like medication to be called in at [facility] [address] [address]

## 2020-02-04 ENCOUNTER — Other Ambulatory Visit: Payer: Self-pay

## 2020-02-04 MED ORDER — ATORVASTATIN CALCIUM 40 MG PO TABS: 40.0000 mg | ORAL_TABLET | Freq: Every day | ORAL | 11 refills | Status: DC

## 2020-02-04 NOTE — Telephone Encounter (Signed)
Rx  sent  to  [facility]

## 2020-03-04 ENCOUNTER — Other Ambulatory Visit: Payer: Self-pay

## 2020-03-06 ENCOUNTER — Ambulatory Visit: Payer: Self-pay | Admitting: Internal Medicine

## 2020-03-11 ENCOUNTER — Other Ambulatory Visit: Payer: Self-pay

## 2020-03-11 DIAGNOSIS — E1165 Type 2 diabetes mellitus with hyperglycemia: Secondary | ICD-10-CM

## 2020-03-12 LAB — HGB A1C W/O EAG: Hgb A1c MFr Bld: 13.2 % — ABNORMAL HIGH (ref 4.8–5.6)

## 2020-03-25 ENCOUNTER — Ambulatory Visit: Payer: Self-pay | Admitting: Internal Medicine

## 2020-03-31 ENCOUNTER — Other Ambulatory Visit: Payer: Self-pay

## 2020-03-31 DIAGNOSIS — E782 Mixed hyperlipidemia: Secondary | ICD-10-CM

## 2020-03-31 DIAGNOSIS — Z79899 Other long term (current) drug therapy: Secondary | ICD-10-CM

## 2020-03-31 NOTE — Progress Notes (Signed)
Here for FLP, hepatic profile

## 2020-04-01 LAB — LIPID PANEL W/O CHOL/HDL RATIO
Cholesterol, Total: 171 mg/dL (ref 100–199)
HDL: 41 mg/dL (ref >39)
LDL Chol Calc (NIH): 106 mg/dL — ABNORMAL HIGH (ref 0–99)
Triglycerides: 132 mg/dL (ref 0–149)
VLDL Cholesterol Cal: 24 mg/dL (ref 5–40)

## 2020-04-24 ENCOUNTER — Encounter: Payer: Self-pay | Admitting: Internal Medicine

## 2020-04-24 ENCOUNTER — Ambulatory Visit: Payer: Self-pay | Admitting: Internal Medicine

## 2020-04-24 VITALS — BP 108/59 | HR 72 | Resp 12 | Ht 62.5 in | Wt 194.0 lb

## 2020-04-24 DIAGNOSIS — E1165 Type 2 diabetes mellitus with hyperglycemia: Secondary | ICD-10-CM

## 2020-04-24 DIAGNOSIS — F439 Reaction to severe stress, unspecified: Secondary | ICD-10-CM

## 2020-04-24 DIAGNOSIS — E782 Mixed hyperlipidemia: Secondary | ICD-10-CM

## 2020-04-24 DIAGNOSIS — Z6832 Body mass index (BMI) 32.0-32.9, adult: Secondary | ICD-10-CM

## 2020-04-24 DIAGNOSIS — E669 Obesity, unspecified: Secondary | ICD-10-CM

## 2020-04-24 MED ORDER — ATORVASTATIN CALCIUM 40 MG PO TABS: ORAL_TABLET | ORAL | 11 refills | Status: DC

## 2020-04-24 NOTE — Progress Notes (Signed)
    Subjective:    Patient ID: [name], female   DOB: 07-Mar-1976, 44 y.o.   MRN: [id number]   HPI   1.  Hyperlipidemia: Discussed her cholesterol is much better, though HDL and LDL not quite at goal.  Not missing Atorvastatin at all.  [facility] shows her picking up every 30 days since 02/04/2020.  2.  DM:  Poorly controlled.  States she is taking meds.  When Checked with [facility], appears she has been picking up all meds for 90 days.  Includes Glipizide, Metformin, Levothyroxine.   She never picked up her Lantus (found out med refills after patient left) Patient then admits she is a stress eater and could use some help with this.    Current Meds  Medication Sig  . atorvastatin (LIPITOR) 40 MG tablet Take 1 tablet (40 mg total) by mouth daily.  Marland Kitchen glipiZIDE (GLUCOTROL) 10 MG tablet Take 1 tablet (10 mg total) by mouth 2 (two) times daily before a meal.  . insulin glargine (LANTUS SOLOSTAR) 100 UNIT/ML Solostar Pen 10 units injected subcutaneously daily in morning.  . Insulin Pen Needle 31G X 6 MM MISC Use for once daily injection  . levonorgestrel (MIRENA) 20 MCG/24HR IUD 1 each by Intrauterine route once. Placed 07/2017  . levothyroxine (SYNTHROID) 150 MCG tablet Take 1 tablet (150 mcg total) by mouth daily.  . metFORMIN (GLUCOPHAGE) 1000 MG tablet Take 1 tablet (1,000 mg total) by mouth 2 (two) times daily with a meal.  . Tea Tree Oil OIL Mix with Gold Bond Foot cream and apply to thickened flaking feet at bedtime daily.   Allergies  Allergen Reactions  . Ace Inhibitors Cough    Per history and chart review 2012.     Review of Systems    Objective:   BP (!) 108/59 (BP Location: Left Arm, Patient Position: Sitting, Cuff Size: Normal)   Pulse 72   Resp 12   Ht 5' 2.5" (1.588 m)   Wt 194 lb (88 kg)   LMP 03/28/2020 (Exact Date)   BMI 34.92 kg/m   Physical Exam  NAD HEENT:  PERRL, EOMI, TMs pearly gray. Neck:  Supple, No adenopathy Chest:  CTA CV: RRR without  murmur or rub. Radial and DP pulses normal and equal Abd:  S, NT, No HSM or mass, + BS LE:  No edema.   Assessment & Plan   1.  Stress and Stress eating:  Would like her to work with Danton Clap, LCSW on this over next few month and then return for fasting labs.  2.  Hyperlipidemia:  Improved with Atorvastatin, but needs to work on lifestyle changes as well.    3.  DM:  Very poorly controlled.  Discussed am very concerned she will develop significant complications if she does not make lifestyle changes now.  Now we know she has not started Lantus, will give her a call and ask her to pick up--will reorder as likely not able to find the Rx so many months back.

## 2020-06-02 ENCOUNTER — Telehealth: Payer: Self-pay | Admitting: Internal Medicine

## 2020-06-02 MED ORDER — LANTUS SOLOSTAR 100 UNIT/ML ~~LOC~~ SOPN: PEN_INJECTOR | SUBCUTANEOUS | 11 refills | Status: DC

## 2020-06-09 NOTE — Telephone Encounter (Signed)
Patient was notified of her new prescription at the [facility]. Patient stated that she's waiting to receive her orange card to pick up her prescription.

## 2020-06-22 ENCOUNTER — Telehealth: Payer: Self-pay | Admitting: Clinical

## 2020-06-22 NOTE — Telephone Encounter (Signed)
LCSW contacted patient as a referral from PCP for counseling. LCSW informed of complaint during visit with MD regarding stress eating. Patient reports this is still current. Discussed process of therapy, cost and clinic no show/cancellation policies. Appointment provided for Jan 6th at 4pm in person. LCSW infiormed if needed prior to contact clinic for sooner appointment.

## 2020-07-09 ENCOUNTER — Other Ambulatory Visit: Payer: Self-pay | Admitting: Clinical

## 2020-07-27 ENCOUNTER — Other Ambulatory Visit: Payer: Self-pay | Admitting: Internal Medicine

## 2020-07-27 ENCOUNTER — Other Ambulatory Visit: Payer: Self-pay

## 2020-07-27 DIAGNOSIS — E782 Mixed hyperlipidemia: Secondary | ICD-10-CM

## 2020-07-27 DIAGNOSIS — E1165 Type 2 diabetes mellitus with hyperglycemia: Secondary | ICD-10-CM

## 2020-07-27 NOTE — Progress Notes (Signed)
Here for urine microalbumin/crea, A1C, FLP, CMP

## 2020-07-28 ENCOUNTER — Other Ambulatory Visit: Payer: Self-pay

## 2020-07-28 LAB — MICROALBUMIN / CREATININE URINE RATIO
Creatinine, Urine: 72.2 mg/dL
Microalb/Creat Ratio: 20 mg/g creat (ref 0–29)
Microalbumin, Urine: 14.5 ug/mL

## 2020-07-28 LAB — COMPREHENSIVE METABOLIC PANEL
ALT: 24 IU/L (ref 0–32)
AST: 22 IU/L (ref 0–40)
Albumin/Globulin Ratio: 1.5 (ref 1.2–2.2)
Albumin: 4 g/dL (ref 3.8–4.8)
Alkaline Phosphatase: 127 IU/L — ABNORMAL HIGH (ref 44–121)
BUN/Creatinine Ratio: 15 (ref 9–23)
BUN: 10 mg/dL (ref 6–24)
Bilirubin Total: 0.4 mg/dL (ref 0.0–1.2)
CO2: 23 mmol/L (ref 20–29)
Calcium: 8.6 mg/dL — ABNORMAL LOW (ref 8.7–10.2)
Chloride: 99 mmol/L (ref 96–106)
Creatinine, Ser: 0.67 mg/dL (ref 0.57–1.00)
GFR calc Af Amer: 125 mL/min/{1.73_m2} (ref >59)
GFR calc non Af Amer: 108 mL/min/{1.73_m2} (ref >59)
Globulin, Total: 2.6 g/dL (ref 1.5–4.5)
Glucose: 258 mg/dL — ABNORMAL HIGH (ref 65–99)
Potassium: 4.3 mmol/L (ref 3.5–5.2)
Sodium: 137 mmol/L (ref 134–144)
Total Protein: 6.6 g/dL (ref 6.0–8.5)

## 2020-07-28 LAB — LIPID PANEL W/O CHOL/HDL RATIO
Cholesterol, Total: 307 mg/dL — ABNORMAL HIGH (ref 100–199)
HDL: 53 mg/dL (ref >39)
LDL Chol Calc (NIH): 211 mg/dL — ABNORMAL HIGH (ref 0–99)
Triglycerides: 222 mg/dL — ABNORMAL HIGH (ref 0–149)
VLDL Cholesterol Cal: 43 mg/dL — ABNORMAL HIGH (ref 5–40)

## 2020-07-28 LAB — HGB A1C W/O EAG: Hgb A1c MFr Bld: 14.3 % — ABNORMAL HIGH (ref 4.8–5.6)

## 2020-07-31 ENCOUNTER — Encounter: Payer: Self-pay | Admitting: Internal Medicine

## 2020-07-31 ENCOUNTER — Ambulatory Visit: Payer: Self-pay | Admitting: Internal Medicine

## 2020-07-31 ENCOUNTER — Other Ambulatory Visit: Payer: Self-pay

## 2020-07-31 VITALS — BP 131/80 | HR 72 | Resp 12 | Ht 62.5 in | Wt 192.0 lb

## 2020-07-31 DIAGNOSIS — E1165 Type 2 diabetes mellitus with hyperglycemia: Secondary | ICD-10-CM

## 2020-07-31 DIAGNOSIS — Z6832 Body mass index (BMI) 32.0-32.9, adult: Secondary | ICD-10-CM

## 2020-07-31 DIAGNOSIS — F439 Reaction to severe stress, unspecified: Secondary | ICD-10-CM

## 2020-07-31 DIAGNOSIS — E782 Mixed hyperlipidemia: Secondary | ICD-10-CM

## 2020-07-31 DIAGNOSIS — E039 Hypothyroidism, unspecified: Secondary | ICD-10-CM

## 2020-07-31 DIAGNOSIS — E669 Obesity, unspecified: Secondary | ICD-10-CM

## 2020-07-31 LAB — GLUCOSE, POCT (MANUAL RESULT ENTRY): POC Glucose: 361 mg/dl — AB (ref 70–99)

## 2020-07-31 MED ORDER — LANTUS SOLOSTAR 100 UNIT/ML ~~LOC~~ SOPN: PEN_INJECTOR | SUBCUTANEOUS | 11 refills | Status: DC

## 2020-07-31 NOTE — Progress Notes (Signed)
° ° °  Subjective:    Patient ID: [name], female   DOB: 11/09/1975, 45 y.o.   MRN: [id number]   HPI   Poor control with both DM and Hyperlipidemia:  Called pharmacy to see what her med pick up history has been. Metformin:  06/13/20 90 days Glipizide:  Picked up 06/13/20 90 days Atorvastatin:  Picked up Jan 3, Nov 2nd prior, Oct 8:  Each 30 days Levothyroxine 06/13/20 90 days Not using Lantus through [name] at all as states was told she needed an orange card. Recent A1C up to 14.3% and Lipids:   Lipid Panel     Component Value Date/Time   CHOL 307 (H) 07/27/2020 1110   TRIG 222 (H) 07/27/2020 1110   HDL 53 07/27/2020 1110   CHOLHDL 3.6 09/03/2015 1619   VLDL 43 (H) 09/03/2015 1619   LDLCALC 211 (H) 07/27/2020 1110   LDLDIRECT 125 (H) 09/08/2008 2046   LABVLDL 43 (H) 07/27/2020 1110     Pan dulce and tortillas are her downfall.  Missing Metformin and Glipizide at least 3 times weekly.  Just forgets and is tired.   Current Meds  Medication Sig   atorvastatin (LIPITOR) 40 MG tablet 1 1/2 tabs by mouth daily   glipiZIDE (GLUCOTROL) 10 MG tablet Take 1 tablet (10 mg total) by mouth 2 (two) times daily before a meal.   levonorgestrel (MIRENA) 20 MCG/24HR IUD 1 each by Intrauterine route once. Placed 07/2017   levothyroxine (SYNTHROID) 150 MCG tablet Take 1 tablet (150 mcg total) by mouth daily.   metFORMIN (GLUCOPHAGE) 1000 MG tablet Take 1 tablet (1,000 mg total) by mouth 2 (two) times daily with a meal.   Allergies  Allergen Reactions   Ace Inhibitors Cough    Per history and chart review 2012.     Review of Systems    Objective:   BP 131/80 (BP Location: Right Arm, Patient Position: Sitting, Cuff Size: Normal)    Pulse 72    Resp 12    Ht 5' 2.5" (1.588 m)    Wt 192 lb (87.1 kg)    LMP 06/27/2020 (Exact Date)    BMI 34.56 kg/m   Physical Exam NAD HEENT:   PERRL, EOMI, Discs sharp Neck:  Supple, No adenopathy,no thyromegaly Chest:  CTA CV:  RRR with  normal S1 and S2, No S3, S4 or murmur.  No carotid bruits.  Carotid, radial and DP pulses normal and equal LE:  No edema.    Assessment & Plan   DM:  Not clear if in denial or why she does not seem to adequately care for herself.  Long discussion regarding this today.  Went over easy to put together meals that would help her eat and take medication regularly.  Also, discussed where to keep meds so she sees them during meals and does not forget.  Personnel officer)  Also discussed she does not require the orange card for the Lantus--she needs to go and apply for [name] and get started with Lantus.  2.  Hyperlipidemia:  as above with meds.  Clear she is missing meds, likely more often than admits today with her numbers.  3.  Hypothyroidism:  went over when to take first thing in morning as well.  To keep this med in bathroom for when first arises.  4.  Stress/denial/?depression:  to meet with Danton Clap, LCSW regularly.  Patient agreeable today with this plan.

## 2020-07-31 NOTE — Patient Instructions (Signed)
On Awakening:  Thyroid medicine --bathroom  Breakfast before work:   Almonds, greek yogurt, berries.  Toast the almonds first as prepare the rest:  1/2 cup of each.  Take Metformin and glipizide with breakfast Medicine in kitchen  Meal with kids after school:  Give Lantus and while eating, take Metformin, glipizide and Atorvastatin.  All meds in [place] view.    Check blood sugars twice daily before breakfast and dinner if possible   Meet with Danton Clap regularly to assess depression and goal making purposes.

## 2020-07-31 NOTE — Progress Notes (Signed)
LCSW met with patient following warm hand off from PCP due to not following up with LCSW previous appointment. PCP reports concern of depressive symptoms and how importance of goal setting can be beneficial for diabetes health management. Patient reports due to cultural perspectives "if I don't need it I don't follow up" is one of those reasons she did not follow up initially, and reports she was unable to share this with provider as she wasn't sure how to explain it. Patient reports she is accepting appointment. LCSW informed patient of an idea of first appointment and it's purpose. LCSW did inform of cost. Patient would prefer 4pm schedule and for first appointment due to day would like virtual/audio. LCSW informed of what to expect on day of appointment. Appointment provided for 02/01 at 4pm via updox.

## 2020-08-04 ENCOUNTER — Telehealth (INDEPENDENT_AMBULATORY_CARE_PROVIDER_SITE_OTHER): Payer: BC Managed Care – PPO | Admitting: Clinical

## 2020-08-04 DIAGNOSIS — F439 Reaction to severe stress, unspecified: Secondary | ICD-10-CM

## 2020-08-05 NOTE — Progress Notes (Signed)
Integrated Behavioral Health Comprehensive Clinical Assessment  MRN: [id number] Name: [name]  Session Time: 4-5pm Total time: 60  Type of Service: Integrated Behavioral Health-Individual Interpretor: No. Interpretor Name and Language: CCA completed in [place] as LCSW is bilingual. It should be noted this session was completed via updox virtual.   Presenting problem:  Patient is a 45 y/o Hispanic female.Therapist met with client as a referral by provider to evaluate for depressive symptoms due to ongoing uncontrollable diabetes and is concern due to no motivation for change.   LCSW asked patient to verbalize why she believed provider referred for services. Patient reports, "I think because to explore why my sugar has increased. She cares for me and wants to take and help me. I feel like I haven't done the things to resolve this issue. It's really discontrolled. Have never been in control, it's always been high." Patient reports no body part has been affected by her diabetes. Patient reports in the past for about a month on her own she was able to lose weight of 10lbs and size 15 to a 9. She reports she doesn't recall what she did to do this.   Patient reports currently the problem is she begins to crave to want to eat. "When I'm hungry, smell, and taste of foods will come to her mind. Patient reports it is a instant after she eats a meal about an 1-2hrs later she will begin to think of wanting to eat. Patient reports her meal schedule is morning 9am breakfast, lunch 3:30p, and 7pm a snack, and sporadic throughout the day snacks something sweet. Patient reports sometimes she will have awareness of her cravings but there is no signs such as stomach growling or thoughts.  Patient reports it is instant moments where she craves something like HiC, coffee. Patient reports recently fully concentrated to not drink coffee, "mentally in my mind I drank 2 cups of tea, I concentrated on my tasks,  and just remembered my discussion with the doctor."   Pt reports she doesn't check her sugar, as there is no symptoms for high, however when it is low she will have shaky hands and sweating. Pt reports before her pregnancy she had diabetes.   Social History:  Who lives in your current household? Patient lives with family, two children ages 78, 66 and her husband of 57 years.   How do describe your family relationships? Good, reports "with my husband it is good, throughout the years things have gotten better. Children, as a mom right now been able to get what they need, and I need such as respect and love from them."  What are your social supports? "very few, can count them on my hand, my world is my children, few as life goes"  What are your hobbies? "movies, be at home, don't like to go out." Do you have any spiritual beliefs? "not at this time"  Education:  What is your highest level of education?9th grade.  Do you have history of developmental delays? No If currently in college or university, current major/program? NA  Employment/Financial Issues:  currently employed Current position:  Freight forwarder at [employer] long have you worked for this employer? For about 18 years History of Employment? NA  If yes, where and how long?  Does your employer have any American Disabilities Act accommodations for you? Current Military status (if applicable include dishonorable discharges and the reason):   Legal History Current/Past arrests, charges, incarcerations, etc: NA Current DSS/DHHS involvement, including foster care:  NA Current DSS Case Worker name, phone number and email: Past DSS/DHHS involvement:    Medical History:   has a past medical history of Diabetes mellitus, Hyperlipidemia, and Thyroid disease. Primary Care Physician: Mack Hook, MD Date of last physical exam: 07/31/20 Allergies:  Allergies  Allergen Reactions  .  Ace Inhibitors Cough    Per history and chart review 2012.   Current medications:  Outpatient Encounter Medications as of 08/04/2020  Medication Sig  . AgaMatrix Ultra-Thin Lancets MISC Check blood sugar before meal twice daily (Patient not taking: No sig reported)  . atorvastatin (LIPITOR) 40 MG tablet 1 1/2 tabs by mouth daily  . Blood Glucose Monitoring Suppl (AGAMATRIX PRESTO) w/Device KIT Check blood sugar twice daily before meals (Patient not taking: No sig reported)  . gabapentin (NEURONTIN) 100 MG capsule 3 caps by mouth morning and afternoon and 4 to 6 caps by mouth at bedtime (Patient not taking: No sig reported)  . glipiZIDE (GLUCOTROL) 10 MG tablet Take 1 tablet (10 mg total) by mouth 2 (two) times daily before a meal.  . glucose blood (AGAMATRIX PRESTO TEST) test strip Check blood sugar twice daily before meals (Patient not taking: No sig reported)  . insulin glargine (LANTUS SOLOSTAR) 100 UNIT/ML Solostar Pen 10 units injected subcutaneously daily in morning.  . Insulin Pen Needle 31G X 6 MM MISC Use for once daily injection (Patient not taking: Reported on 07/31/2020)  . levonorgestrel (MIRENA) 20 MCG/24HR IUD 1 each by Intrauterine route once. Placed 07/2017  . levothyroxine (SYNTHROID) 150 MCG tablet Take 1 tablet (150 mcg total) by mouth daily.  . metFORMIN (GLUCOPHAGE) 1000 MG tablet Take 1 tablet (1,000 mg total) by mouth 2 (two) times daily with a meal.  . Tea Tree Oil OIL Mix with Gold [place] Foot cream and apply to thickened flaking feet at bedtime daily. (Patient not taking: Reported on 07/31/2020)   No facility-administered encounter medications on file as of 08/04/2020.   Have you ever had any serious medication reactions? No  Psychiatric History (mental health or substance abuse)  Have you ever been treated for a mental health/substance use problem? No If "Yes", when were you treated and whom did you see?  Dates from-Date to: NA Provider: NA Treatment Type:  NA Outcome/Follow Up: NA  Family History: Is there any history of mental health problems or substance abuse in your family? No Has anyone in your family been hospitalized for mental health treatment? No   Mental Status:  General appearance/Behavior: Neat Eye contact: Good Motor behavior: Normal Speech: Normal Level of consciousness: Alert Mood: NA Affect: Appropriate Thought process: Coherent Thought content: WNL Perception: Normal Judgment: Good Insight: Present Intellect:  WNL Memory: Within Normal limits Orientation: Full Orientated  Attention/Concentration Adequate  Comments:   Sleep Usual bedtime is 9pm, depends sometimes 10pm.  Sleeping arrangements: with her husband Problems with snoring: No Obstructive sleep apnea is not a concern. Problems with nightmares: No Problems with sleepwalking: No   Patient does reports moments where too much sleep, lately this is discontrolled with her diabetes.   Trauma History: Have you ever experienced or been exposed to any form of abuse?  Emotional? no Physical? no Sexual/assault? no Neglect? "as a child, but  I am still here" Have you ever witnessed/been exposed to something traumatic? No  Do you have any current symptoms? NA  Substance Abuse:  Do you use alcohol, nicotine or caffeine? Just coffee Have you ever used illicit drugs or abused prescription medications?  If yes? Substance Type-  Route-  Age of first use?  Amount of Use? Frequency? Last use?  Do you have any problems with the following symptoms?  Reason for use, any motivation to stop, what is stopping from use ?   Risk Assessment: Current danger to self Thoughts of suicide/death:  Self-harming behaviors:    Suicide attempt:  Has plan:    Comments/clarify:  Pt denied active SI.      Past danger to self Thoughts of suicide/death:  Self-harming behaviors:    Suicide attempt:  Family history of suicide:    Comments/clarify: Pt denied past SI.       Current danger to others Thoughts to harm others:  Plans to harm others:    Threats to harm others:  Attempt to harm others:    Comments/clarify: Pt denied active HI     Past danger to others Thoughts to harm others:  Plans to harm others:    Threats to harm others:  Attempt to harm others:    Comments/clarify: Pt denied past HI.     RISK TO SELF Low to no risk: x Moderate risk:  Severe risk:   RISK TO OTHERS Low to no risk: x Moderate risk:  Severe risk:     Do you have any protective factors that keep you from attempting? NA due to no current SI/HI  Psychosocial strengths and stressors: Relationship support/concerns/needs: NA Financial concerns/needs:  NA Financial resources (other sources of income, not from job):  NA Housing concerns/needs: NA  Diagnosis   ICD-10-CM   1. Stress  F43.9     Patient outcome?  What do you want out of treatment? "to have will power, my motivation is my children, to be part of their future." "for those instances that I want sugar want to cope with the craving."  GOALS ADDRESSED: Patient will reduce symptoms of cravings for sugar, increase knowledge and/or ability of: healthy habits and also: Increase motivation to adhere to plan of care             INTERVENTIONS: Interventions utilized: none at this time.  Standardized Assessments completed: GAD-7 and PHQ 9(8) question 3 &5 relate to her diabetes.   PLAN:  Based on screeners, presenting symptoms and diagnosis social worker is recommending therapy.  Scheduled next visit: 02/17 4pm virtual  [place] [facility]

## 2020-08-20 ENCOUNTER — Telehealth (INDEPENDENT_AMBULATORY_CARE_PROVIDER_SITE_OTHER): Payer: BC Managed Care – PPO | Admitting: Clinical

## 2020-08-20 DIAGNOSIS — F439 Reaction to severe stress, unspecified: Secondary | ICD-10-CM

## 2020-08-25 NOTE — Progress Notes (Signed)
   THERAPY PROGRESS NOTE Via updox  Session Time: 4-5:00pm Participation Level: Active Behavioral Response: CasualAlertEuthymic Type of Therapy: Individual Therapy Treatment Goals addressed: Patient will reduce symptoms of cravings for sugar, increase knowledge and/or ability of: healthy habits and also: Increase motivation to adhere to plan of care   Purpose: LCSW met with client for routine individual therapy to work towards treatment goals: Patient will reduce symptoms of cravings for sugar, increase knowledge and/or ability of: healthy habits and also: Increase motivation to adhere to plan of care. "develop will power to manage diabetes"  Intervention: LCSW met with client for first routine individual therapy to begin to work treatment goal: diabetes management by reducing symptoms of cravings, increase knowledge and motivation of healthy habits and "develop will power". LCSW provided patient opportunity to check in to assess for any significant events and assess how she is doing today. It should be noted this session purpose to begin to develop therapeutic relationship for patient and LCSW. LCSW informed patient of thereapeutic process and expectations. LCSW informed patient role of clinician. LCSW utilized intervention of Supportive Counseling as patient processed current feelings observing recent external factors of events. LCSW validated patients feelings and discussed the circle of control with patient to help cope with these feelings. LCSW praised patient with her values and raising of her children and her relationship with her family. LCSW informed patient for next session LCSW will continue research to develop skills to manage her diabetes. LCSW encouraged patient for homework to do self care activities. LCSW assessed for SI/HI/command psychosis.  Effectiveness: Patient is alert x4 affect. Patient reports she is doing okay today, has been working and shared she is looking forward to the  weekend to rest. Patient provided updates in regards to self management for her diabetes reports some slight change such as less sugar, however does notes when she cook there is days she does not want to eat it, and is unsure as to why. Patient recognized smell increases temptations. Patient was receptive to LCSW role and expectations of therapy. For this session  patient processed recent feelings of stress due to current events in the news (child died by suicide due to bullying). Patient processed frustration how there is children that bully others and parents roles in these type of situations. Patient shared question is why she has to follow the rules while there is others that get away with it. Patient shared her learning from life experiences and teaching her children these values. Patient shared her relationship with her children as she hopes they follow these same values.  LCSW validated her feelings and frustration. Patient was receptive to the circle of control and acknowledged she can only control what she does, her behaviors, choices, etc. Patient agreed to self care homework. Intervention was effective as patient was able to process her feelings and receptive to LCSW. Progress towards goal is Ongoing. Patient denied active suicidal/homicidal/active psychosis.  Plan Patient offered next appointment for: 03/03 4pm  Diagnosis: stress   Next- Chap 7 step 5 mindfulness for cravings.    Lujean Rave, LCSW 08/25/2020

## 2020-09-03 ENCOUNTER — Telehealth: Payer: Self-pay | Admitting: Clinical

## 2020-09-14 ENCOUNTER — Telehealth: Payer: Self-pay | Admitting: Clinical

## 2020-09-14 NOTE — Telephone Encounter (Signed)
LCSW contacted patient to reschedule appt scheduled on Thursday at 4pm due to LCSW in training. LCSW Left vm with purpose of phone call if she would like to reschedule

## 2020-09-16 ENCOUNTER — Other Ambulatory Visit: Payer: Self-pay

## 2020-09-16 ENCOUNTER — Ambulatory Visit: Payer: Self-pay | Admitting: Internal Medicine

## 2020-09-16 ENCOUNTER — Encounter: Payer: Self-pay | Admitting: Internal Medicine

## 2020-09-16 VITALS — BP 129/77 | HR 77 | Resp 12 | Ht 62.5 in | Wt 193.5 lb

## 2020-09-16 DIAGNOSIS — E1165 Type 2 diabetes mellitus with hyperglycemia: Secondary | ICD-10-CM | POA: Diagnosis not present

## 2020-09-16 NOTE — Progress Notes (Signed)
° ° ° °  Subjective:    Patient ID: [name], female   DOB: 02-06-1976, 45 y.o.   MRN: [id number]   HPI   DM:  Has picked up Lantus and taking around 4 p.m daily and is taking Glipizide and Metformin twice daily.  Has cut back on tortillas--basically from 6 tortillas to 3 now with one meal daily.  Has tried to cut back on sugary foods--cannot just suddenly stop.   Forgot her sugar journal. Fasting blood sugars still over 200 and in the evening sugars are 180-200.   Not clear she is jounaling regularly.  Has been meeting with Dwan Bolt, LCSW to work on self care.   Current Meds  Medication Sig   AgaMatrix Ultra-Thin Lancets MISC Check blood sugar before meal twice daily   atorvastatin (LIPITOR) 40 MG tablet 1 1/2 tabs by mouth daily   Blood Glucose Monitoring Suppl (AGAMATRIX PRESTO) w/Device KIT Check blood sugar twice daily before meals   glipiZIDE (GLUCOTROL) 10 MG tablet Take 1 tablet (10 mg total) by mouth 2 (two) times daily before a meal.   glucose blood (AGAMATRIX PRESTO TEST) test strip Check blood sugar twice daily before meals   insulin glargine (LANTUS SOLOSTAR) 100 UNIT/ML Solostar Pen 10 units injected subcutaneously daily in morning.   Insulin Pen Needle 31G X 6 MM MISC Use for once daily injection   levonorgestrel (MIRENA) 20 MCG/24HR IUD 1 each by Intrauterine route once. Placed 07/2017   levothyroxine (SYNTHROID) 150 MCG tablet Take 1 tablet (150 mcg total) by mouth daily.   metFORMIN (GLUCOPHAGE) 1000 MG tablet Take 1 tablet (1,000 mg total) by mouth 2 (two) times daily with a meal.   Allergies  Allergen Reactions   Ace Inhibitors Cough    Per history and chart review 2012.     Review of Systems    Objective:   BP 129/77 (BP Location: Left Arm, Patient Position: Sitting, Cuff Size: Normal)    Pulse 77    Resp 12    Ht 5' 2.5" (1.588 m)    Wt 193 lb 8 oz (87.8 kg)    LMP 06/27/2020 (Approximate)    BMI 34.83 kg/m   Physical Exam NAD Lungs:   CTA CV:  RRR without murmur or rub.  Radial pulses normal and equal LE:  No edema.   Assessment & Plan  DM: Does not appear to be making small incremental goals consistently as discussed previously, though sugars perhaps a bit better and now with Lantus.  To drop off journal for me to see in 1 month and follow up with A1C in 3 months followed by visit.

## 2020-09-17 ENCOUNTER — Telehealth: Payer: Self-pay | Admitting: Clinical

## 2020-11-20 ENCOUNTER — Telehealth: Payer: Self-pay | Admitting: Clinical

## 2020-11-20 NOTE — Telephone Encounter (Signed)
LCSW contacted patient to check in as LCSW has not heard from patient and check in how she was doing and if she was interested in returning for therapy. LCSW apologized for not calling earlier too.  Patient reports she also apologizes for not calling as well. Patient report she has been very busy but good, unsure of scheduling due to her kids activities, and work. Patient expressed gratitude for the call and will contact LCSW when things stabilize to reschedule new appointment.

## 2020-12-27 ENCOUNTER — Other Ambulatory Visit: Payer: Self-pay | Admitting: Internal Medicine

## 2020-12-28 ENCOUNTER — Other Ambulatory Visit: Payer: Self-pay

## 2020-12-28 ENCOUNTER — Other Ambulatory Visit: Payer: Self-pay | Admitting: Internal Medicine

## 2020-12-28 DIAGNOSIS — R300 Dysuria: Secondary | ICD-10-CM

## 2020-12-28 LAB — POCT URINALYSIS DIPSTICK
Bilirubin, UA: NEGATIVE
Blood, UA: NEGATIVE
Glucose, UA: POSITIVE — AB
Ketones, UA: NEGATIVE
Nitrite, UA: NEGATIVE
Protein, UA: NEGATIVE
Spec Grav, UA: 1.02 (ref 1.010–1.025)
Urobilinogen, UA: 1 E.U./dL
pH, UA: 5 (ref 5.0–8.0)

## 2020-12-28 MED ORDER — PHENAZOPYRIDINE HCL 200 MG PO TABS: 200.0000 mg | ORAL_TABLET | Freq: Three times a day (TID) | ORAL | 0 refills | Status: DC | PRN

## 2020-12-28 MED ORDER — CIPROFLOXACIN HCL 500 MG PO TABS: 500.0000 mg | ORAL_TABLET | Freq: Two times a day (BID) | ORAL | 0 refills | Status: AC

## 2020-12-29 ENCOUNTER — Other Ambulatory Visit: Payer: Self-pay | Admitting: Internal Medicine

## 2020-12-29 MED ORDER — GLIPIZIDE 10 MG PO TABS: ORAL_TABLET | ORAL | 11 refills | Status: DC

## 2020-12-29 MED ORDER — LEVOTHYROXINE SODIUM 150 MCG PO TABS: 150.0000 ug | ORAL_TABLET | Freq: Every day | ORAL | 11 refills | Status: DC

## 2021-01-02 LAB — URINE CULTURE

## 2021-02-04 ENCOUNTER — Telehealth: Payer: Self-pay

## 2021-02-04 MED ORDER — LANTUS SOLOSTAR 100 UNIT/ML ~~LOC~~ SOPN: PEN_INJECTOR | SUBCUTANEOUS | 11 refills | Status: DC

## 2021-02-04 NOTE — Telephone Encounter (Signed)
Pt call to transfer latus medication to [facility]. Rx changed and pt notifed

## 2021-02-08 ENCOUNTER — Other Ambulatory Visit: Payer: Self-pay | Admitting: Internal Medicine

## 2021-02-12 ENCOUNTER — Ambulatory Visit: Payer: Self-pay | Admitting: Internal Medicine

## 2021-02-25 ENCOUNTER — Other Ambulatory Visit: Payer: Self-pay | Admitting: Internal Medicine

## 2021-03-09 ENCOUNTER — Telehealth: Payer: Self-pay

## 2021-03-09 NOTE — Telephone Encounter (Signed)
Appt--okay for acute

## 2021-03-09 NOTE — Telephone Encounter (Signed)
Pt called to report bumps in right axillary region that appeared about a week ago. Bumps look like pimple and have drainage with beige color mixed with blood. No odor from drainage. More continue to grow after some go away. Bumps give pain and redness. Has been taking acetaminophen for pain. Would like appointment

## 2021-03-10 NOTE — Telephone Encounter (Signed)
Pt schedule 03/29/21

## 2021-03-17 ENCOUNTER — Encounter: Payer: Self-pay | Admitting: Internal Medicine

## 2021-03-17 ENCOUNTER — Other Ambulatory Visit: Payer: Self-pay

## 2021-03-17 ENCOUNTER — Ambulatory Visit (INDEPENDENT_AMBULATORY_CARE_PROVIDER_SITE_OTHER): Payer: BC Managed Care – PPO | Admitting: Internal Medicine

## 2021-03-17 VITALS — BP 130/74 | HR 68 | Resp 14 | Ht 62.5 in | Wt 195.0 lb

## 2021-03-17 DIAGNOSIS — E1165 Type 2 diabetes mellitus with hyperglycemia: Secondary | ICD-10-CM | POA: Diagnosis not present

## 2021-03-17 DIAGNOSIS — L02411 Cutaneous abscess of right axilla: Secondary | ICD-10-CM

## 2021-03-17 MED ORDER — CEPHALEXIN 500 MG PO CAPS: ORAL_CAPSULE | ORAL | 0 refills | Status: DC

## 2021-03-17 MED ORDER — INSULIN GLARGINE 100 UNIT/ML SOLOSTAR PEN: PEN_INJECTOR | SUBCUTANEOUS | 6 refills | Status: DC

## 2021-03-17 NOTE — Progress Notes (Signed)
    Subjective:    Patient ID: [name], female   DOB: [birth year], 45 y.o.   MRN: [id number]   HPI   Bumps in right axilla with discharge at one point.  Experimented with waxing her armpits--daughter performed.  In days following, swelled and very erythematous eventually opening and draining.  Happened last week in August.  Still with tenderness and hardness in area.  She was unable to get in to clinic prior.    2.  DM:  not using Lantus--cost $100 with insurance.  Needs 600 units per month.      Current Meds  Medication Sig   AgaMatrix Ultra-Thin Lancets MISC Check blood sugar before meal twice daily   atorvastatin (LIPITOR) 40 MG tablet 1 1/2 tabs by mouth daily   Blood Glucose Monitoring Suppl (AGAMATRIX PRESTO) w/Device KIT Check blood sugar twice daily before meals   glipiZIDE (GLUCOTROL) 10 MG tablet TAKE 1 TABLET BY MOUTH TWICE DAILY BEFORE MEAL(S)   glucose blood (AGAMATRIX PRESTO TEST) test strip Check blood sugar twice daily before meals   levonorgestrel (MIRENA) 20 MCG/24HR IUD 1 each by Intrauterine route once. Placed 07/2017   levothyroxine (EUTHYROX) 150 MCG tablet Take 1 tablet (150 mcg total) by mouth daily.   metFORMIN (GLUCOPHAGE) 1000 MG tablet TAKE 1 TABLET BY MOUTH TWICE DAILY WITH MEALS   Allergies  Allergen Reactions   Ace Inhibitors Cough    Per history and chart review 2012.     Review of Systems    Objective:   BP 130/74 (BP Location: Left Arm, Patient Position: Sitting, Cuff Size: Normal)   Pulse 68   Resp 14   Ht 5' 2.5" (1.588 m)   Wt 195 lb (88.5 kg)   BMI 35.10 kg/m   Physical Exam NAD Lungs:  CTA CV:  RRR without murmur or rub. Right axilla with skin fold with thickening and pink coloration.  No fluctuance, discharge, or tenderness   Assessment & Plan    Mildly residual inflammation of axilla:  Cephalexin 500 mg twice daily for 7 days.  Warm pack.    2.  DM:  Again, long discussion regarding concern for either denial  of DM diagnosis or that not treating it adequately will lead to significant complications. Appears Lantus solostar is covered with her insurance and will try adding that twice daily.  She needs to call if unable to obtain medications.

## 2021-03-29 ENCOUNTER — Ambulatory Visit: Payer: BC Managed Care – PPO | Admitting: Internal Medicine

## 2021-04-05 ENCOUNTER — Ambulatory Visit: Payer: Self-pay | Admitting: Internal Medicine

## 2021-05-20 ENCOUNTER — Telehealth: Payer: Self-pay

## 2021-05-20 NOTE — Telephone Encounter (Signed)
Pt called to report heel pain that she has had since 04/28/2021. Pain has made it difficult to walk. Not other symptoms. No taking any medication. Would like immediate appointment.  Recommended to visit urgent care if pain is too severe before we can get he scheduled.

## 2021-05-21 ENCOUNTER — Other Ambulatory Visit: Payer: Self-pay

## 2021-05-21 ENCOUNTER — Encounter (HOSPITAL_COMMUNITY): Payer: Self-pay

## 2021-05-21 ENCOUNTER — Ambulatory Visit (HOSPITAL_COMMUNITY)
Admission: RE | Admit: 2021-05-21 | Discharge: 2021-05-21 | Disposition: A | Discharge status: Home / Self Care | Payer: BC Managed Care – PPO | Source: Ambulatory Visit | Attending: Nurse Practitioner | Admitting: Nurse Practitioner

## 2021-05-21 ENCOUNTER — Ambulatory Visit (INDEPENDENT_AMBULATORY_CARE_PROVIDER_SITE_OTHER): Payer: BC Managed Care – PPO

## 2021-05-21 VITALS — BP 131/73 | HR 79 | Temp 98.2°F | Resp 17

## 2021-05-21 DIAGNOSIS — M722 Plantar fascial fibromatosis: Secondary | ICD-10-CM | POA: Diagnosis not present

## 2021-05-21 DIAGNOSIS — M79671 Pain in right foot: Secondary | ICD-10-CM

## 2021-05-21 MED ORDER — IBUPROFEN 800 MG PO TABS: 800.0000 mg | ORAL_TABLET | Freq: Three times a day (TID) | ORAL | 0 refills | Status: DC | PRN

## 2021-05-21 NOTE — Discharge Instructions (Addendum)
  XR of your foot did not show anything abnormal   Your symptoms is likely due to plantar fasciitis which is a painful foot condition that affects the heel.   Read the attached instructions to learn more about this condition   It is important to wear good supporting shoes especially while at work since you have to be on your feet for long periods of time   [employer] is a store that focuses on providing you with shoes that fit properly. They have two locations in the area.        [address] [place], [place] [address]        [address] [place], [place] [address]  You can also try purchasing shoe inserts if you want to try a more affordable option.   Follow-up with orthopedics if you do not see any improvement even with these supportive measures

## 2021-05-21 NOTE — ED Triage Notes (Signed)
Pt presents with c/o R heel pain. States it has bee going on x 1 week. States she struggles to stand and denies injuries.

## 2021-05-21 NOTE — ED Provider Notes (Signed)
[facility]    CSN: [id number] Arrival date & time: 05/21/21  1144      History   Chief Complaint Chief Complaint  Patient presents with   Foot Pain    heel    HPI [name] is a 45 y.o. female.   History of Present Illness  [name] is a 45 y.o. female patient complains of pain in the posterior aspect of the right heel without radiation. Onset of symptoms was about 4 weeks ago after wearing a heeled boot. Patient describes pain as aching and throbbing. Pain severity now is 4 /10. Pain is aggravated by movement, weight bearing, standing for extended periods of time and at the end of the workday. Pain is alleviated some by rest. Patient denies any numbness, tingling, weakness, loss of sensation, loss of motion, or inability to bear weight. The patient denies other injuries.  She has no prior history of foot problems. Care prior to arrival consisted of nothing, with no relief.    Past Medical History:  Diagnosis Date   Diabetes mellitus    Hyperlipidemia    Thyroid disease     Patient Active Problem List   Diagnosis Date Noted   Diabetic peripheral neuropathy ([place]) 06/18/2019   Elevated BP without diagnosis of hypertension 06/18/2019   IUD (intrauterine device) in place 03/26/2015   Carpal tunnel syndrome 03/26/2015   Obesity 40/98/1191   Non-alcoholic fatty liver disease 06/25/2007   Hypothyroidism 04/09/2007   Uncontrolled type 2 diabetes mellitus with hyperglycemia, without long-term current use of insulin ([place]) 04/09/2007   Hyperlipidemia 04/09/2007    History reviewed. No pertinent surgical history.  OB History   No obstetric history on file.      Home Medications    Prior to Admission medications   Medication Sig Start Date End Date Taking? Authorizing Provider  ibuprofen (IBU) 800 MG tablet Take 1 tablet (800 mg total) by mouth every 8 (eight) hours as needed (PAIN). 05/21/21  Yes Enrique Sack, FNP  AgaMatrix  Ultra-Thin Lancets MISC Check blood sugar before meal twice daily 07/31/19   Mack Hook, MD  atorvastatin (LIPITOR) 40 MG tablet 1 1/2 tabs by mouth daily 04/24/20   Mack Hook, MD  Blood Glucose Monitoring Suppl (AGAMATRIX PRESTO) w/Device KIT Check blood sugar twice daily before meals 07/31/19   Mack Hook, MD  cephALEXin (KEFLEX) 500 MG capsule 1 cap by mouth twice daily for 7 days. 03/17/21   Mack Hook, MD  gabapentin (NEURONTIN) 100 MG capsule 3 caps by mouth morning and afternoon and 4 to 6 caps by mouth at bedtime Patient not taking: No sig reported 07/31/19   Mack Hook, MD  glipiZIDE (GLUCOTROL) 10 MG tablet TAKE 1 TABLET BY MOUTH TWICE DAILY BEFORE MEAL(S) 12/29/20   Mack Hook, MD  glucose blood (AGAMATRIX PRESTO TEST) test strip Check blood sugar twice daily before meals 07/31/19   Mack Hook, MD  insulin glargine (LANTUS) 100 UNIT/ML Solostar Pen 10 units injected subcutaneously twice daily 03/17/21   Mack Hook, MD  Insulin Pen Needle 31G X 6 MM MISC Use for once daily injection Patient not taking: Reported on 03/17/2021 12/04/19   Mack Hook, MD  levonorgestrel (MIRENA) 20 MCG/24HR IUD 1 each by Intrauterine route once. Placed 07/2017    [provider]  levothyroxine (EUTHYROX) 150 MCG tablet Take 1 tablet (150 mcg total) by mouth daily. 12/29/20   Mack Hook, MD  metFORMIN (GLUCOPHAGE) 1000 MG tablet TAKE 1 TABLET BY MOUTH TWICE DAILY WITH  MEALS 12/29/20   Mack Hook, MD  phenazopyridine (PYRIDIUM) 200 MG tablet Take 1 tablet (200 mg total) by mouth 3 (three) times daily as needed for pain. Patient not taking: Reported on 03/17/2021 12/28/20   Mack Hook, MD  Tea Tree Oil OIL Mix with Gold Bond Foot cream and apply to thickened flaking feet at bedtime daily. Patient not taking: No sig reported 03/19/19   Mack Hook, MD    Family History History reviewed. No pertinent  family history.  Social History Social History   Tobacco Use   Smoking status: Never   Smokeless tobacco: Never  Substance Use Topics   Alcohol use: No     Allergies   Ace inhibitors   Review of Systems Review of Systems  Musculoskeletal:  Negative for gait problem.       Right heel pain  Skin:  Negative for wound.  Neurological:  Negative for weakness.  All other systems reviewed and are negative.   Physical Exam Triage Vital Signs ED Triage Vitals  Enc Vitals Group     BP 05/21/21 1231 131/73     Pulse Rate 05/21/21 1231 79     Resp 05/21/21 1231 17     Temp 05/21/21 1231 98.2 F (36.8 C)     Temp Source 05/21/21 1231 Oral     SpO2 05/21/21 1231 99 %     Weight --      Height --      Head Circumference --      Peak Flow --      Pain Score 05/21/21 1229 4     Pain Loc --      Pain Edu? --      Excl. in [place]? --    No data found.  Updated Vital Signs BP 131/73 (BP Location: Right Arm)   Pulse 79   Temp 98.2 F (36.8 C) (Oral)   Resp 17   LMP  (LMP Unknown)   SpO2 99%   Visual Acuity Right Eye Distance:   Left Eye Distance:   Bilateral Distance:    Right Eye Near:   Left Eye Near:    Bilateral Near:     Physical Exam Vitals and nursing note reviewed.  Constitutional:      Appearance: Normal appearance.  HENT:     Head: Normocephalic.  Cardiovascular:     Rate and Rhythm: Normal rate.     Pulses:          Dorsalis pedis pulses are 2+ on the right side.  Pulmonary:     Effort: Pulmonary effort is normal.  Musculoskeletal:        General: Normal range of motion.     Cervical back: Normal range of motion and neck supple.     Right foot: Normal range of motion. Tenderness present. No swelling, deformity, foot drop or bony tenderness.     Left foot: Normal.       Feet:  Feet:     Right foot:     Skin integrity: Skin integrity normal.     Toenail Condition: Right toenails are normal.  Skin:    General: Skin is warm and dry.   Neurological:     General: No focal deficit present.     Mental Status: She is alert and oriented to person, place, and time.     UC Treatments / Results  Labs (all labs ordered are listed, but only abnormal results are displayed) Labs Reviewed - No data to display  EKG   Radiology DG Foot Complete Right  Result Date: 05/21/2021 CLINICAL DATA:  Heel pain, no injury EXAM: RIGHT FOOT COMPLETE - 3+ VIEW COMPARISON:  None. FINDINGS: There is no evidence of fracture or dislocation. There is no evidence of arthropathy or other focal bone abnormality. Soft tissues are unremarkable. IMPRESSION: No fracture or dislocation of the right foot. No radiographic abnormality with specific attention to the heel. Electronically Signed   By: Delanna Ahmadi M.D.   On: 05/21/2021 13:55    Procedures Procedures (including critical care time)  Medications Ordered in UC Medications - No data to display  Initial Impression / Assessment and Plan / UC Course  I have reviewed the triage vital signs and the nursing notes.  Pertinent labs & imaging results that were available during my care of the patient were reviewed by me and considered in my medical decision making (see chart for details).     45 yo female presenting with right heel pain. XR of the foot showed no fracture, dislocation or abnormality of the right foot. Highly suspect plantar fasciitis as the source of her pain. Discussed diagnosis and treatment options as well as indications for orthopedic follow-up.   Today's evaluation has revealed no signs of a dangerous process. Discussed diagnosis with patient and/or guardian. Patient and/or guardian aware of their diagnosis, possible red flag symptoms to watch out for and need for close follow up. Patient and/or guardian understands verbal and written discharge instructions. Patient and/or guardian comfortable with plan and disposition.  Patient and/or guardian has a clear mental status at this time,  good insight into illness (after discussion and teaching) and has clear judgment to make decisions regarding their care  This care was provided during an unprecedented National Emergency due to the Novel Coronavirus (COVID-19) pandemic. COVID-19 infections and transmission risks place heavy strains on healthcare resources.  As this pandemic evolves, our facility, providers, and staff strive to respond fluidly, to remain operational, and to provide care relative to available resources and information. Outcomes are unpredictable and treatments are without well-defined guidelines. Further, the impact of COVID-19 on all aspects of urgent care, including the impact to patients seeking care for reasons other than COVID-19, is unavoidable during this national emergency. At this time of the global pandemic, management of patients has significantly changed, even for non-COVID positive patients given high local and regional COVID volumes at this time requiring high healthcare system and resource utilization. The standard of care for management of both COVID suspected and non-COVID suspected patients continues to change rapidly at the local, regional, national, and global levels. This patient was worked up and treated to the best available but ever changing evidence and resources available at this current time.   Documentation was completed with the aid of voice recognition software. Transcription may contain typographical errors. Final Clinical Impressions(s) / UC Diagnoses   Final diagnoses:  Plantar fasciitis of right foot     Discharge Instructions       XR of your foot did not show anything abnormal   Your symptoms is likely due to plantar fasciitis which is a painful foot condition that affects the heel.   Read the attached instructions to learn more about this condition   It is important to wear good supporting shoes especially while at work since you have to be on your feet for long periods of  time   [employer] is a store that focuses on providing you with shoes that fit properly. They  have two locations in the area.        [place], [place], [place] can also try purchasing shoe inserts if you want to try a more affordable option.   Follow-up with orthopedics if you do not see any improvement even with these supportive measures      ED Prescriptions     Medication Sig Dispense Auth. Provider   ibuprofen (IBU) 800 MG tablet Take 1 tablet (800 mg total) by mouth every 8 (eight) hours as needed (PAIN). 30 tablet Enrique Sack, FNP      PDMP not reviewed this encounter.   Enrique Sack, [place] 05/21/21 1434

## 2021-05-26 NOTE — Telephone Encounter (Signed)
Schedule next available/wait list if necessary

## 2021-06-03 NOTE — Telephone Encounter (Signed)
Patient has been seen by Dr Mulberry °

## 2021-07-01 ENCOUNTER — Encounter: Payer: Self-pay | Admitting: Internal Medicine

## 2021-07-01 ENCOUNTER — Ambulatory Visit: Payer: BC Managed Care – PPO | Admitting: Internal Medicine

## 2021-07-30 ENCOUNTER — Other Ambulatory Visit: Payer: Self-pay | Admitting: Internal Medicine

## 2021-12-08 ENCOUNTER — Other Ambulatory Visit: Payer: Self-pay

## 2021-12-08 MED ORDER — LEVOTHYROXINE SODIUM 150 MCG PO TABS: 150.0000 ug | ORAL_TABLET | Freq: Every day | ORAL | 2 refills | Status: DC

## 2022-01-13 ENCOUNTER — Other Ambulatory Visit: Payer: Self-pay | Admitting: Internal Medicine

## 2022-03-29 NOTE — Progress Notes (Signed)
[facility] Note  04/04/2022     CHIEF COMPLAINT Patient presents for Retina Evaluation   HISTORY OF PRESENT ILLNESS: [name] is a 46 y.o. female who presents to the clinic today for:   HPI     Retina Evaluation   In both eyes.  This started 6 months ago.  Duration of 2 weeks.  I, the attending physician,  performed the HPI with the patient and updated documentation appropriately.        Comments   [facility]opathy OU. Pt states about 6 mo ago she noticed slight/progressive changes in [place]. 2 weeks ago pt was seen at [facility] and referred to [place] but she is unsure why. Pt is diabetic, reports dx in 2005. Last A1C checked 15.5, 2 years ago. Pt has rx specs, less than a week old.       Last edited by Bernarda Caffey, MD on 04/04/2022  9:36 AM.    Pt is here on the referral of [facility] for concern of diabetic retinopathy, pt states she went to see them to get new glasses, she is having a hard time driving at night, she has not noticed a decrease in vision, pt is diabetic, her A1c was 14.3 in January 2022, it has not been checked since then, she checks her blood sugar in the morning, it's usually around 250, she is on metformin and glipizide and says she takes it regularly, she has not seen her dr in about a year  Referring physician: Mack Hook, MD [place],  [place] [id number]  HISTORICAL INFORMATION:   Selected notes from the MEDICAL RECORD NUMBER [facility]:  Ocular Hx- PMH-    CURRENT MEDICATIONS: No current outpatient medications on file. (Ophthalmic Drugs)   No current facility-administered medications for this visit. (Ophthalmic Drugs)   Current Outpatient Medications (Other)  Medication Sig   atorvastatin (LIPITOR) 40 MG tablet TAKE 1 & 1/2 (ONE & ONE-HALF) TABLETS BY MOUTH ONCE DAILY   glipiZIDE (GLUCOTROL) 10 MG tablet TAKE 1 TABLET BY MOUTH TWICE DAILY  BEFORE MEAL(S)   levonorgestrel (MIRENA) 20 MCG/24HR IUD 1 each by Intrauterine route once. Placed 07/2017   levothyroxine (EUTHYROX) 150 MCG tablet Take 1 tablet (150 mcg total) by mouth daily.   metFORMIN (GLUCOPHAGE) 1000 MG tablet TAKE 1 TABLET BY MOUTH TWICE DAILY WITH MEALS   AgaMatrix Ultra-Thin Lancets MISC Check blood sugar before meal twice daily (Patient not taking: Reported on 04/04/2022)   Blood Glucose Monitoring Suppl (AGAMATRIX PRESTO) w/Device KIT Check blood sugar twice daily before meals (Patient not taking: Reported on 04/04/2022)   cephALEXin (KEFLEX) 500 MG capsule 1 cap by mouth twice daily for 7 days. (Patient not taking: Reported on 04/04/2022)   gabapentin (NEURONTIN) 100 MG capsule 3 caps by mouth morning and afternoon and 4 to 6 caps by mouth at bedtime (Patient not taking: Reported on 04/24/2020)   glucose blood (AGAMATRIX PRESTO TEST) test strip Check blood sugar twice daily before meals (Patient not taking: Reported on 04/04/2022)   ibuprofen (IBU) 800 MG tablet Take 1 tablet (800 mg total) by mouth every 8 (eight) hours as needed (PAIN). (Patient not taking: Reported on 04/04/2022)   insulin glargine (LANTUS) 100 UNIT/ML Solostar Pen 10 units injected subcutaneously twice daily (Patient not taking: Reported on 04/04/2022)   Insulin Pen Needle 31G X 6 MM MISC Use for once  daily injection (Patient not taking: Reported on 03/17/2021)   phenazopyridine (PYRIDIUM) 200 MG tablet Take 1 tablet (200 mg total) by mouth 3 (three) times daily as needed for pain. (Patient not taking: Reported on 03/17/2021)   Tea Tree Oil OIL Mix with Gold Bond Foot cream and apply to thickened flaking feet at bedtime daily. (Patient not taking: Reported on 07/31/2020)   No current facility-administered medications for this visit. (Other)   REVIEW OF SYSTEMS: ROS   Positive for: Endocrine, Eyes Negative for: Constitutional, Gastrointestinal, Neurological, Skin, Genitourinary, Musculoskeletal, HENT,  Cardiovascular, Respiratory, Psychiatric, Allergic/Imm, Heme/Lymph Last edited by Kingsley Spittle, COT on 04/04/2022  9:18 AM.     ALLERGIES Allergies  Allergen Reactions   Ace Inhibitors Cough    Per history and chart review 2012.   PAST MEDICAL HISTORY Past Medical History:  Diagnosis Date   Diabetes mellitus    Hyperlipidemia    Thyroid disease    History reviewed. No pertinent surgical history.  FAMILY HISTORY Family History  Problem Relation Age of Onset   Diabetes Father    Diabetes Sister    SOCIAL HISTORY Social History   Tobacco Use   Smoking status: Never   Smokeless tobacco: Never  Substance Use Topics   Alcohol use: No   Drug use: Not Currently       OPHTHALMIC EXAM:  Base Eye Exam     Visual Acuity (Snellen - Linear)       Right Left   Dist cc 20/20 20/20 -1    Correction: Glasses         Tonometry (Tonopen, 9:27 AM)       Right Left   Pressure 17 17         Pupils       Dark Light Shape React APD   Right 3 2 Round Brisk None   Left 3 2 Round Brisk None         Visual Fields (Counting fingers)       Left Right    Full Full         Extraocular Movement       Right Left    Full, Ortho Full, Ortho         Neuro/Psych     Oriented x3: Yes   Mood/Affect: Normal         Dilation     Both eyes: 1.0% Mydriacyl, 2.5% Phenylephrine @ 9:28 AM           Slit Lamp and Fundus Exam     Slit Lamp Exam       Right Left   Lids/Lashes mild MGD mild MGD   Conjunctiva/Sclera White and quiet White and quiet   Cornea 1+ inferior Punctate epithelial erosions, mild tear film debris 1+ inferior Punctate epithelial erosions, mild tear film debris   Anterior Chamber deep and clear deep and clear   Iris Round and dilated, No NVI Round and dilated, No NVI   Lens Clear Clear   Anterior Vitreous mild syneresis mild syneresis         Fundus Exam       Right Left   Disc Pink and Sharp, +SVP, focal drance heme at  1030 Pink and Sharp   C/D Ratio 0.2 0.3   Macula Good foveal reflex, scattered MA, cluster of punctate exudates superior macula Good foveal reflex, scattered MA/DBH, focal punctate exudates   Vessels attenuated, copper wiring, mild tortuosity, no obvious NV attenuated, copper wiring, mild tortuosity,  no obvious NV   Periphery Attached, scattered MA, mild WWP temporally Attached, scattered MA, mild WWP temporally           Refraction     Wearing Rx       Sphere Cylinder Axis Add   Right -2.75 +0.25 099 +1.50   Left -2.25 Sphere  +1.50           IMAGING AND PROCEDURES  Imaging and Procedures for 04/04/2022  OCT, Retina - OU - Both Eyes       Right Eye Quality was good. Central Foveal Thickness: 232. Progression has no prior data. Findings include normal foveal contour, no IRF, no SRF, vitreomacular adhesion .   Left Eye Quality was good. Central Foveal Thickness: 237. Progression has no prior data. Findings include normal foveal contour, no IRF, no SRF, vitreomacular adhesion .   Notes *Images captured and stored on drive  Diagnosis / Impression:  NFP, no IRF/SRF OU No DME OU  Clinical management:  See below  Abbreviations: NFP - Normal foveal profile. CME - cystoid macular edema. PED - pigment epithelial detachment. IRF - intraretinal fluid. SRF - subretinal fluid. EZ - ellipsoid zone. ERM - epiretinal membrane. ORA - outer retinal atrophy. ORT - outer retinal tubulation. SRHM - subretinal hyper-reflective material. IRHM - intraretinal hyper-reflective material            ASSESSMENT/PLAN:    ICD-10-CM   1. Moderate nonproliferative diabetic retinopathy of both eyes without macular edema associated with type 2 diabetes mellitus (HCC)  E11.3393 OCT, Retina - OU - Both Eyes     Moderate nonproliferative diabetic retinopathy w/o DME, both eyes  - last A1c 14.3 (01.24.22)  - pt states the her fasting BG ~250 at home -- has not seen PCP in ~1 year or longer -  The incidence, risk factors for progression, natural history and treatment options for diabetic retinopathy were discussed with patient.   - The need for close monitoring of blood glucose, blood pressure, and serum lipids, avoiding cigarette or any type of tobacco, and the need for long term follow up was also discussed with patient. - exam shows scattered MA, punctate exudates OU - OCT without diabetic macular edema, both eyes  - f/u in 4 mos -- DFE/OCT/FA  Ophthalmic Meds Ordered this visit:  No orders of the defined types were placed in this encounter.    Return in about 4 months (around 08/05/2022) for f/u NPDR OU, DFE, OCT, FA.  There are no Patient Instructions on file for this visit.   Explained the diagnoses, plan, and follow up with the patient and they expressed understanding.  Patient expressed understanding of the importance of proper follow up care.   This document serves as a record of services personally performed by Brian G. Zamora, MD, PhD. It was created on their behalf by Daryl Barber, COMT. The creation of this record is the provider's dictation and/or activities during the visit.  Electronically signed by: Daryl Barber, COMT 04/04/22 12:37 PM  This document serves as a record of services personally performed by Brian G. Zamora, MD, PhD. It was created on their behalf by Amanda J. Brown, OA an ophthalmic technician. The creation of this record is the provider's dictation and/or activities during the visit.    Electronically signed by: Amanda J. Brown, OA 10.02.2023 12:37 PM  Brian G. Zamora, M.D., Ph.D. Diseases & Surgery of the Retina and Vitreous [facility]  I have reviewed the above   documentation for accuracy and completeness, and I agree with the above. Brian G. Zamora, M.D., Ph.D. 04/04/22 12:42 PM   Abbreviations: M myopia (nearsighted); A astigmatism; H hyperopia (farsighted); P presbyopia; Mrx spectacle prescription;  CTL contact  lenses; OD right eye; OS left eye; OU both eyes  XT exotropia; ET esotropia; PEK punctate epithelial keratitis; PEE punctate epithelial erosions; DES dry eye syndrome; MGD meibomian gland dysfunction; ATs artificial tears; PFAT's preservative free artificial tears; NSC nuclear sclerotic cataract; PSC posterior subcapsular cataract; ERM epi-retinal membrane; PVD posterior vitreous detachment; RD retinal detachment; DM diabetes mellitus; DR diabetic retinopathy; NPDR non-proliferative diabetic retinopathy; PDR proliferative diabetic retinopathy; CSME clinically significant macular edema; DME diabetic macular edema; dbh dot blot hemorrhages; CWS cotton wool spot; POAG primary open angle glaucoma; C/D cup-to-disc ratio; HVF humphrey visual field; GVF goldmann visual field; OCT optical coherence tomography; IOP intraocular pressure; BRVO Branch retinal vein occlusion; CRVO central retinal vein occlusion; CRAO central retinal artery occlusion; BRAO branch retinal artery occlusion; RT retinal tear; SB scleral buckle; PPV pars plana vitrectomy; VH Vitreous hemorrhage; PRP panretinal laser photocoagulation; IVK intravitreal kenalog; VMT vitreomacular traction; MH Macular hole;  NVD neovascularization of the disc; NVE neovascularization elsewhere; AREDS age related eye disease study; ARMD age related macular degeneration; POAG primary open angle glaucoma; EBMD epithelial/anterior basement membrane dystrophy; ACIOL anterior chamber intraocular lens; IOL intraocular lens; PCIOL posterior chamber intraocular lens; Phaco/IOL phacoemulsification with intraocular lens placement; PRK photorefractive keratectomy; LASIK laser assisted in situ keratomileusis; HTN hypertension; DM diabetes mellitus; COPD chronic obstructive pulmonary disease  

## 2022-04-04 ENCOUNTER — Encounter (INDEPENDENT_AMBULATORY_CARE_PROVIDER_SITE_OTHER): Payer: Self-pay | Admitting: Ophthalmology

## 2022-04-04 ENCOUNTER — Ambulatory Visit (INDEPENDENT_AMBULATORY_CARE_PROVIDER_SITE_OTHER): Payer: Self-pay | Admitting: Ophthalmology

## 2022-04-04 DIAGNOSIS — E113393 Type 2 diabetes mellitus with moderate nonproliferative diabetic retinopathy without macular edema, bilateral: Secondary | ICD-10-CM

## 2022-04-10 ENCOUNTER — Other Ambulatory Visit: Payer: Self-pay | Admitting: Internal Medicine

## 2022-04-15 ENCOUNTER — Other Ambulatory Visit: Payer: Self-pay | Admitting: Internal Medicine

## 2022-05-09 ENCOUNTER — Other Ambulatory Visit: Payer: Self-pay | Admitting: Internal Medicine

## 2022-05-14 ENCOUNTER — Other Ambulatory Visit: Payer: Self-pay | Admitting: Internal Medicine

## 2022-07-11 ENCOUNTER — Telehealth: Payer: Self-pay

## 2022-07-11 NOTE — Telephone Encounter (Signed)
Telephoned patient at mobile number. Left a voice message with [facility] contact information. 

## 2022-07-28 ENCOUNTER — Ambulatory Visit (INDEPENDENT_AMBULATORY_CARE_PROVIDER_SITE_OTHER): Payer: Self-pay | Admitting: Internal Medicine

## 2022-07-28 ENCOUNTER — Encounter: Payer: Self-pay | Admitting: Internal Medicine

## 2022-07-28 VITALS — BP 146/90 | HR 76 | Resp 12 | Ht 62.5 in | Wt 194.0 lb

## 2022-07-28 DIAGNOSIS — E039 Hypothyroidism, unspecified: Secondary | ICD-10-CM

## 2022-07-28 DIAGNOSIS — Z6832 Body mass index (BMI) 32.0-32.9, adult: Secondary | ICD-10-CM

## 2022-07-28 DIAGNOSIS — E669 Obesity, unspecified: Secondary | ICD-10-CM

## 2022-07-28 DIAGNOSIS — E1165 Type 2 diabetes mellitus with hyperglycemia: Secondary | ICD-10-CM

## 2022-07-28 DIAGNOSIS — Z789 Other specified health status: Secondary | ICD-10-CM

## 2022-07-28 DIAGNOSIS — E782 Mixed hyperlipidemia: Secondary | ICD-10-CM

## 2022-07-28 DIAGNOSIS — I10 Essential (primary) hypertension: Secondary | ICD-10-CM

## 2022-07-28 MED ORDER — LEVOTHYROXINE SODIUM 150 MCG PO TABS: 150.0000 ug | ORAL_TABLET | Freq: Every day | ORAL | 3 refills | Status: DC

## 2022-07-28 MED ORDER — METFORMIN HCL 1000 MG PO TABS: 1000.0000 mg | ORAL_TABLET | Freq: Two times a day (BID) | ORAL | 3 refills | Status: DC

## 2022-07-28 MED ORDER — GLIPIZIDE 10 MG PO TABS: ORAL_TABLET | ORAL | 3 refills | Status: DC

## 2022-07-28 MED ORDER — GABAPENTIN 100 MG PO CAPS: ORAL_CAPSULE | ORAL | 4 refills | Status: DC

## 2022-07-28 MED ORDER — LOSARTAN POTASSIUM 50 MG PO TABS: 50.0000 mg | ORAL_TABLET | Freq: Every day | ORAL | 3 refills | Status: DC

## 2022-07-28 MED ORDER — ATORVASTATIN CALCIUM 40 MG PO TABS: ORAL_TABLET | ORAL | 3 refills | Status: DC

## 2022-07-28 NOTE — Progress Notes (Signed)
    Subjective:    Patient ID: [name], female   DOB: 1975-08-19, 47 y.o.   MRN: [id number]   HPI  Here after over 1 year.     DM/hypertension/hyperlipidemia;  Has been off medication since November as had not been seen since 03/2021.  Never. Used Lantus after one fill.  States was about 1 year ago.   Following with Dr. Coralyn Pear for diabetic retinopathy. She states she is finally ready to "love herself"  and wants to start taking better care of her health issues.    2.  Peripheral neuropathy of feet:  states no longer bothers her.  She has not used Gabapentin in some time.  3.  Elevated BP:  intermittently elevated previously.  Previously also with ACE I cough.    Current Meds  Medication Sig   levonorgestrel (MIRENA) 20 MCG/24HR IUD 1 each by Intrauterine route once. Placed 07/2017  To be removed 2026  Allergies  Allergen Reactions   Ace Inhibitors Cough    Per history and chart review 2012.     Review of Systems    Objective:   BP (!) 146/90 (BP Location: Right Arm, Patient Position: Sitting, Cuff Size: Normal)   Pulse 76   Resp 12   Ht 5' 2.5" (1.588 m)   Wt 194 lb (88 kg)   BMI 34.92 kg/m   Physical Exam NAD HEENT:  PERRL, EOMI.  Hypopigmentation from vitiligo involving much of face.   Neck:  Supple, No adenoapthy Chest;  CTA CV:  RRR without murmur or rub.  Radial and DP pulses normal and equal LE:  No edema   Assessment & Plan  Labs:  CBC, CMP, lipids, A1C, urine microalbumin, TSH   DM:  restart Glipizide, Metformin.  Consider Jardiance with [name] in future as did not continue with daily Lantus previously.  2.  Hypertension:  Losartan 50 mg daily.  Checking kidney function.  Return in 2 weeks for BMP/BP check  3.  Hyperlipidemia:  FLP and restart Atorvastatin.    4.  Hypothyroidism:  Levothyroxine renewed.  5.  Referral to Gala Romney, LCSW-A for work on how to focus on self care and removing barriers.  Encouraged her to make small  easy to do goals and gradually build on them.  Entire family should be working together on healthy eating habits and physical activity.  She plans to start riding bikes with her son.  Have offered to have family come in with her to understand the importance of change in care for her mom/supporting her.

## 2022-08-02 LAB — CBC WITH DIFFERENTIAL/PLATELET
Basophils Absolute: 0 10*3/uL (ref 0.0–0.2)
Basos: 1 %
EOS (ABSOLUTE): 0.1 10*3/uL (ref 0.0–0.4)
Eos: 1 %
Hematocrit: 42.8 % (ref 34.0–46.6)
Hemoglobin: 14.1 g/dL (ref 11.1–15.9)
Immature Grans (Abs): 0 10*3/uL (ref 0.0–0.1)
Immature Granulocytes: 1 %
Lymphocytes Absolute: 2.4 10*3/uL (ref 0.7–3.1)
Lymphs: 39 %
MCH: 31.1 pg (ref 26.6–33.0)
MCHC: 32.9 g/dL (ref 31.5–35.7)
MCV: 94 fL (ref 79–97)
Monocytes Absolute: 0.3 10*3/uL (ref 0.1–0.9)
Monocytes: 4 %
Neutrophils Absolute: 3.3 10*3/uL (ref 1.4–7.0)
Neutrophils: 54 %
Platelets: 199 10*3/uL (ref 150–450)
RBC: 4.54 x10E6/uL (ref 3.77–5.28)
RDW: 12.8 % (ref 11.7–15.4)
WBC: 6.1 10*3/uL (ref 3.4–10.8)

## 2022-08-02 LAB — HGB A1C W/O EAG: Hgb A1c MFr Bld: >15.5 % — ABNORMAL HIGH (ref 4.8–5.6)

## 2022-08-02 LAB — COMPREHENSIVE METABOLIC PANEL
ALT: 48 IU/L — ABNORMAL HIGH (ref 0–32)
AST: 36 IU/L (ref 0–40)
Albumin/Globulin Ratio: 1.2 (ref 1.2–2.2)
Albumin: 3.6 g/dL — ABNORMAL LOW (ref 3.9–4.9)
Alkaline Phosphatase: 180 IU/L — ABNORMAL HIGH (ref 44–121)
BUN/Creatinine Ratio: 11 (ref 9–23)
BUN: 10 mg/dL (ref 6–24)
Bilirubin Total: 0.3 mg/dL (ref 0.0–1.2)
CO2: 23 mmol/L (ref 20–29)
Calcium: 8.4 mg/dL — ABNORMAL LOW (ref 8.7–10.2)
Chloride: 92 mmol/L — ABNORMAL LOW (ref 96–106)
Creatinine, Ser: 0.93 mg/dL (ref 0.57–1.00)
Globulin, Total: 2.9 g/dL (ref 1.5–4.5)
Glucose: 463 mg/dL — ABNORMAL HIGH (ref 70–99)
Potassium: 4.3 mmol/L (ref 3.5–5.2)
Sodium: 130 mmol/L — ABNORMAL LOW (ref 134–144)
Total Protein: 6.5 g/dL (ref 6.0–8.5)
eGFR: 77 mL/min/{1.73_m2} (ref >59)

## 2022-08-02 LAB — MICROALBUMIN, URINE: Microalbumin, Urine: 101 ug/mL

## 2022-08-02 LAB — LIPID PANEL W/O CHOL/HDL RATIO
Cholesterol, Total: 528 mg/dL — ABNORMAL HIGH (ref 100–199)
HDL: 46 mg/dL (ref >39)
Triglycerides: 842 mg/dL (ref 0–149)

## 2022-08-02 LAB — TSH: TSH: 175 u[IU]/mL — ABNORMAL HIGH (ref 0.450–4.500)

## 2022-08-04 NOTE — Progress Notes (Shared)
[facility] Note  08/08/2022     CHIEF COMPLAINT Patient presents for No chief complaint on file.   HISTORY OF PRESENT ILLNESS: [name] is a 47 y.o. female who presents to the clinic today for:    Pt is here on the referral of [facility] for concern of diabetic retinopathy, pt states she went to see them to get new glasses, she is having a hard time driving at night, she has not noticed a decrease in vision, pt is diabetic, her A1c was 14.3 in January 2022, it has not been checked since then, she checks her blood sugar in the morning, it's usually around 250, she is on metformin and glipizide and says she takes it regularly, she has not seen her dr in about a year  Referring physician: Mack Hook, MD [place],  [place] [id number]  HISTORICAL INFORMATION:   Selected notes from the MEDICAL RECORD NUMBER [facility]:  Ocular Hx- PMH-    CURRENT MEDICATIONS: No current outpatient medications on file. (Ophthalmic Drugs)   No current facility-administered medications for this visit. (Ophthalmic Drugs)   Current Outpatient Medications (Other)  Medication Sig   AgaMatrix Ultra-Thin Lancets MISC Check blood sugar before meal twice daily (Patient not taking: Reported on 04/04/2022)   atorvastatin (LIPITOR) 40 MG tablet TAKE 1 & 1/2 (ONE & ONE-HALF) TABLETS BY MOUTH ONCE DAILY   Blood Glucose Monitoring Suppl (AGAMATRIX PRESTO) w/Device KIT Check blood sugar twice daily before meals (Patient not taking: Reported on 04/04/2022)   gabapentin (NEURONTIN) 100 MG capsule 3 caps by mouth morning and afternoon and 4 to 6 caps by mouth at bedtime   glipiZIDE (GLUCOTROL) 10 MG tablet 1 tab by mouth twice daily with food   glucose blood (AGAMATRIX PRESTO TEST) test strip Check blood sugar twice daily before meals (Patient not taking: Reported on 04/04/2022)   ibuprofen (IBU) 800 MG tablet Take 1 tablet (800 mg total) by mouth every 8  (eight) hours as needed (PAIN). (Patient not taking: Reported on 04/04/2022)   insulin glargine (LANTUS) 100 UNIT/ML Solostar Pen 10 units injected subcutaneously twice daily (Patient not taking: Reported on 04/04/2022)   Insulin Pen Needle 31G X 6 MM MISC Use for once daily injection (Patient not taking: Reported on 03/17/2021)   levonorgestrel (MIRENA) 20 MCG/24HR IUD 1 each by Intrauterine route once. Placed 07/2017.  States to be removed 2026   levothyroxine (SYNTHROID) 150 MCG tablet Take 1 tablet (150 mcg total) by mouth daily.   losartan (COZAAR) 50 MG tablet Take 1 tablet (50 mg total) by mouth daily.   metFORMIN (GLUCOPHAGE) 1000 MG tablet Take 1 tablet (1,000 mg total) by mouth 2 (two) times daily with a meal.   No current facility-administered medications for this visit. (Other)   REVIEW OF SYSTEMS:   ALLERGIES Allergies  Allergen Reactions   Ace Inhibitors Cough    Per history and chart review 2012.   PAST MEDICAL HISTORY Past Medical History:  Diagnosis Date   Diabetes mellitus    Hyperlipidemia    Thyroid disease    No past surgical history on file.  FAMILY HISTORY Family History  Problem Relation Age of Onset   Diabetes Father    Diabetes Sister    SOCIAL HISTORY Social History   Tobacco Use   Smoking status: Never   Smokeless tobacco: Never  Substance Use Topics   Alcohol use: No   Drug use: Not  Currently       OPHTHALMIC EXAM:  Not recorded    IMAGING AND PROCEDURES  Imaging and Procedures for 08/08/2022          ASSESSMENT/PLAN:    ICD-10-CM   1. Moderate nonproliferative diabetic retinopathy of both eyes without macular edema associated with type 2 diabetes mellitus (HCC)  I95.1884       Moderate nonproliferative diabetic retinopathy w/o DME, both eyes  - last A1c 14.3 (01.24.22)  - pt states the her fasting BG ~250 at home -- has not seen PCP in ~1 year or longer - The incidence, risk factors for progression, natural history and  treatment options for diabetic retinopathy were discussed with patient.   - The need for close monitoring of blood glucose, blood pressure, and serum lipids, avoiding cigarette or any type of tobacco, and the need for long term follow up was also discussed with patient. - exam shows scattered MA, punctate exudates OU - OCT without diabetic macular edema, both eyes  - f/u in 4 mos -- DFE/OCT/FA  Ophthalmic Meds Ordered this visit:  No orders of the defined types were placed in this encounter.    No follow-ups on file.  There are no Patient Instructions on file for this visit.   Explained the diagnoses, plan, and follow up with the patient and they expressed understanding.  Patient expressed understanding of the importance of proper follow up care.   This document serves as a record of services personally performed by Gardiner Sleeper, MD, PhD. It was created on their behalf by Roselee Nova, COMT. The creation of this record is the provider's dictation and/or activities during the visit.  Electronically signed by: Roselee Nova, COMT 08/04/22 2:15 PM   Gardiner Sleeper, M.D., Ph.D. Diseases & Surgery of the Retina and Vitreous [facility] [place]: M myopia (nearsighted); A astigmatism; H hyperopia (farsighted); P presbyopia; Mrx spectacle prescription;  CTL contact lenses; OD right eye; OS left eye; OU both eyes  XT exotropia; ET esotropia; PEK punctate epithelial keratitis; PEE punctate epithelial erosions; DES dry eye syndrome; MGD meibomian gland dysfunction; ATs artificial tears; PFAT's preservative free artificial tears; [place] nuclear sclerotic cataract; PSC posterior subcapsular cataract; ERM epi-retinal membrane; PVD posterior vitreous detachment; RD retinal detachment; DM diabetes mellitus; DR diabetic retinopathy; NPDR non-proliferative diabetic retinopathy; PDR proliferative diabetic retinopathy; CSME clinically significant macular edema; DME diabetic  macular edema; dbh dot blot hemorrhages; CWS cotton wool spot; POAG primary open angle glaucoma; C/D cup-to-disc ratio; HVF humphrey visual field; GVF goldmann visual field; OCT optical coherence tomography; IOP intraocular pressure; BRVO Branch retinal vein occlusion; CRVO central retinal vein occlusion; CRAO central retinal artery occlusion; BRAO branch retinal artery occlusion; RT retinal tear; SB scleral buckle; PPV pars plana vitrectomy; VH Vitreous hemorrhage; PRP panretinal laser photocoagulation; IVK intravitreal kenalog; VMT vitreomacular traction; MH Macular hole;  NVD neovascularization of the disc; NVE neovascularization elsewhere; AREDS age related eye disease study; ARMD age related macular degeneration; POAG primary open angle glaucoma; EBMD epithelial/anterior basement membrane dystrophy; ACIOL anterior chamber intraocular lens; IOL intraocular lens; PCIOL posterior chamber intraocular lens; Phaco/IOL phacoemulsification with intraocular lens placement; [place] photorefractive keratectomy; LASIK laser assisted in situ keratomileusis; HTN hypertension; DM diabetes mellitus; COPD chronic obstructive pulmonary disease

## 2022-08-08 ENCOUNTER — Encounter (INDEPENDENT_AMBULATORY_CARE_PROVIDER_SITE_OTHER): Payer: Self-pay | Admitting: Ophthalmology

## 2022-08-08 DIAGNOSIS — E113393 Type 2 diabetes mellitus with moderate nonproliferative diabetic retinopathy without macular edema, bilateral: Secondary | ICD-10-CM

## 2022-08-10 NOTE — Progress Notes (Shared)
[facility] Note  08/15/2022     CHIEF COMPLAINT Patient presents for No chief complaint on file.   HISTORY OF PRESENT ILLNESS: [name] is a 47 y.o. female who presents to the clinic today for:    Pt is here on the referral of [facility] for concern of diabetic retinopathy, pt states she went to see them to get new glasses, she is having a hard time driving at night, she has not noticed a decrease in vision, pt is diabetic, her A1c was 14.3 in January 2022, it has not been checked since then, she checks her blood sugar in the morning, it's usually around 250, she is on metformin and glipizide and says she takes it regularly, she has not seen her dr in about a year  Referring physician: Mack Hook, MD [place],  [place] [id number]  HISTORICAL INFORMATION:   Selected notes from the MEDICAL RECORD NUMBER [facility]:  Ocular Hx- PMH-    CURRENT MEDICATIONS: No current outpatient medications on file. (Ophthalmic Drugs)   No current facility-administered medications for this visit. (Ophthalmic Drugs)   Current Outpatient Medications (Other)  Medication Sig   AgaMatrix Ultra-Thin Lancets MISC Check blood sugar before meal twice daily (Patient not taking: Reported on 04/04/2022)   atorvastatin (LIPITOR) 40 MG tablet TAKE 1 & 1/2 (ONE & ONE-HALF) TABLETS BY MOUTH ONCE DAILY   Blood Glucose Monitoring Suppl (AGAMATRIX PRESTO) w/Device KIT Check blood sugar twice daily before meals (Patient not taking: Reported on 04/04/2022)   gabapentin (NEURONTIN) 100 MG capsule 3 caps by mouth morning and afternoon and 4 to 6 caps by mouth at bedtime   glipiZIDE (GLUCOTROL) 10 MG tablet 1 tab by mouth twice daily with food   glucose blood (AGAMATRIX PRESTO TEST) test strip Check blood sugar twice daily before meals (Patient not taking: Reported on 04/04/2022)   ibuprofen (IBU) 800 MG tablet Take 1 tablet (800 mg total) by mouth every 8  (eight) hours as needed (PAIN). (Patient not taking: Reported on 04/04/2022)   insulin glargine (LANTUS) 100 UNIT/ML Solostar Pen 10 units injected subcutaneously twice daily (Patient not taking: Reported on 04/04/2022)   Insulin Pen Needle 31G X 6 MM MISC Use for once daily injection (Patient not taking: Reported on 03/17/2021)   levonorgestrel (MIRENA) 20 MCG/24HR IUD 1 each by Intrauterine route once. Placed 07/2017.  States to be removed 2026   levothyroxine (SYNTHROID) 150 MCG tablet Take 1 tablet (150 mcg total) by mouth daily.   losartan (COZAAR) 50 MG tablet Take 1 tablet (50 mg total) by mouth daily.   metFORMIN (GLUCOPHAGE) 1000 MG tablet Take 1 tablet (1,000 mg total) by mouth 2 (two) times daily with a meal.   No current facility-administered medications for this visit. (Other)   REVIEW OF SYSTEMS:   ALLERGIES Allergies  Allergen Reactions   Ace Inhibitors Cough    Per history and chart review 2012.   PAST MEDICAL HISTORY Past Medical History:  Diagnosis Date   Diabetes mellitus    Hyperlipidemia    Thyroid disease    No past surgical history on file.  FAMILY HISTORY Family History  Problem Relation Age of Onset   Diabetes Father    Diabetes Sister    SOCIAL HISTORY Social History   Tobacco Use   Smoking status: Never   Smokeless tobacco: Never  Substance Use Topics   Alcohol use: No   Drug use: Not  Currently       OPHTHALMIC EXAM:  Not recorded    IMAGING AND PROCEDURES  Imaging and Procedures for 08/15/2022          ASSESSMENT/PLAN:    ICD-10-CM   1. Moderate nonproliferative diabetic retinopathy of both eyes without macular edema associated with type 2 diabetes mellitus (HCC)  O67.1245       Moderate nonproliferative diabetic retinopathy w/o DME, both eyes  - last A1c 14.3 (01.24.22)  - pt states the her fasting BG ~250 at home -- has not seen PCP in ~1 year or longer - The incidence, risk factors for progression, natural history and  treatment options for diabetic retinopathy were discussed with patient.   - The need for close monitoring of blood glucose, blood pressure, and serum lipids, avoiding cigarette or any type of tobacco, and the need for long term follow up was also discussed with patient. - exam shows scattered MA, punctate exudates OU - OCT without diabetic macular edema, both eyes  - f/u in 4 mos -- DFE/OCT/FA  Ophthalmic Meds Ordered this visit:  No orders of the defined types were placed in this encounter.    No follow-ups on file.  There are no Patient Instructions on file for this visit.   Explained the diagnoses, plan, and follow up with the patient and they expressed understanding.  Patient expressed understanding of the importance of proper follow up care.   This document serves as a record of services personally performed by Gardiner Sleeper, MD, PhD. It was created on their behalf by Roselee Nova, COMT. The creation of this record is the provider's dictation and/or activities during the visit.  Electronically signed by: Roselee Nova, COMT 08/10/22 10:44 AM    Gardiner Sleeper, M.D., Ph.D. Diseases & Surgery of the Retina and Vitreous [facility] [place]: M myopia (nearsighted); A astigmatism; H hyperopia (farsighted); P presbyopia; Mrx spectacle prescription;  CTL contact lenses; OD right eye; OS left eye; OU both eyes  XT exotropia; ET esotropia; PEK punctate epithelial keratitis; PEE punctate epithelial erosions; DES dry eye syndrome; MGD meibomian gland dysfunction; ATs artificial tears; PFAT's preservative free artificial tears; [place] nuclear sclerotic cataract; PSC posterior subcapsular cataract; ERM epi-retinal membrane; PVD posterior vitreous detachment; RD retinal detachment; DM diabetes mellitus; DR diabetic retinopathy; NPDR non-proliferative diabetic retinopathy; PDR proliferative diabetic retinopathy; CSME clinically significant macular edema; DME  diabetic macular edema; dbh dot blot hemorrhages; CWS cotton wool spot; POAG primary open angle glaucoma; C/D cup-to-disc ratio; HVF humphrey visual field; GVF goldmann visual field; OCT optical coherence tomography; IOP intraocular pressure; BRVO Branch retinal vein occlusion; CRVO central retinal vein occlusion; CRAO central retinal artery occlusion; BRAO branch retinal artery occlusion; RT retinal tear; SB scleral buckle; PPV pars plana vitrectomy; VH Vitreous hemorrhage; PRP panretinal laser photocoagulation; IVK intravitreal kenalog; VMT vitreomacular traction; MH Macular hole;  NVD neovascularization of the disc; NVE neovascularization elsewhere; AREDS age related eye disease study; ARMD age related macular degeneration; POAG primary open angle glaucoma; EBMD epithelial/anterior basement membrane dystrophy; ACIOL anterior chamber intraocular lens; IOL intraocular lens; PCIOL posterior chamber intraocular lens; Phaco/IOL phacoemulsification with intraocular lens placement; [place] photorefractive keratectomy; LASIK laser assisted in situ keratomileusis; HTN hypertension; DM diabetes mellitus; COPD chronic obstructive pulmonary disease

## 2022-08-15 ENCOUNTER — Encounter (INDEPENDENT_AMBULATORY_CARE_PROVIDER_SITE_OTHER): Payer: Self-pay | Admitting: Ophthalmology

## 2022-08-15 DIAGNOSIS — E113393 Type 2 diabetes mellitus with moderate nonproliferative diabetic retinopathy without macular edema, bilateral: Secondary | ICD-10-CM

## 2022-10-06 ENCOUNTER — Ambulatory Visit
Admission: RE | Admit: 2022-10-06 | Discharge: 2022-10-06 | Disposition: A | Discharge status: Home / Self Care | Payer: Self-pay | Source: Ambulatory Visit | Attending: Internal Medicine | Admitting: Internal Medicine

## 2022-10-06 VITALS — BP 136/80 | HR 64 | Temp 98.2°F | Resp 16

## 2022-10-06 DIAGNOSIS — Z794 Long term (current) use of insulin: Secondary | ICD-10-CM

## 2022-10-06 DIAGNOSIS — M79672 Pain in left foot: Secondary | ICD-10-CM

## 2022-10-06 DIAGNOSIS — E11628 Type 2 diabetes mellitus with other skin complications: Secondary | ICD-10-CM

## 2022-10-06 NOTE — ED Triage Notes (Signed)
Pt states left foot pain for the past 2 days denies injury.  Redness noted to bottom of foot with slight swelling.  Pt ambulates with a limp.

## 2022-10-06 NOTE — Discharge Instructions (Signed)
Monitor site of pain and redness for further signs of infection such as worsening redness, swelling, ulceration, pain, or new numbness/tingling to the toes.  Please continue taking your diabetes medications as prescribed.  Epsom salt soaks may help to reduce inflammation and swelling/pain to the area.  You may take Tylenol 1000 mg every 6 hours as needed for pain.  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  Tylenol and Epsom salt soaks can help with this as well as elevating her legs at the end of a long workday to reduce swelling.  I am so proud of you for lowering your sugars!  Return to urgent care if you notice worsening signs of infection, otherwise you may follow-up with your primary care provider as needed.

## 2022-10-06 NOTE — ED Provider Notes (Signed)
[facility]    CSN: [id number] Arrival date & time: 10/06/22  1246      History   Chief Complaint Chief Complaint  Patient presents with   Foot Pain    HPI [name] is a 47 y.o. female.   Patient presents to urgent care for evaluation of redness and swelling to the sole of the medial left foot that started 2 days ago.  She is a type II diabetic and is concerned that she may be developing a foot infection to this area.  She states that pain is worse with her first few steps in the morning to the left foot but then improved significantly throughout the day.  She also noticed a small area of swelling associated with the area of redness a couple of days ago but this has since resolved.  No warmth or underlying soft tissue swelling currently to the left foot.  No recent antibiotic or steroid use.  No recent trauma/injury to the left foot.  She does state that she is on her feet frequently for work.  No orthopnea, calf/ankle swelling, calf pain, or fever/chills.  She has a history of diabetic peripheral neuropathy but denies new numbness or tingling distally to the area of erythema.  She states her sugars have been greatly improved over the last 10 days since change in medication.  Previously before the medication change 10 days ago, her fasting blood sugars will be in the 300s.  Yesterday, fasting blood sugar was 140-150.  This is significant improvement.     Past Medical History:  Diagnosis Date   Diabetes mellitus    Hyperlipidemia    Thyroid disease     Patient Active Problem List   Diagnosis Date Noted   Hypertension 07/28/2022   Diabetic peripheral neuropathy 06/18/2019   Elevated BP without diagnosis of hypertension 06/18/2019   IUD (intrauterine device) in place 03/26/2015   Carpal tunnel syndrome 03/26/2015   Obesity [id number]   Non-alcoholic fatty liver disease 06/25/2007   Hypothyroidism 04/09/2007   Uncontrolled type 2 diabetes mellitus  with hyperglycemia, without long-term current use of insulin 04/09/2007   Hyperlipidemia 04/09/2007    History reviewed. No pertinent surgical history.  OB History   No obstetric history on file.      Home Medications    Prior to Admission medications   Medication Sig Start Date End Date Taking? Authorizing Provider  AgaMatrix Ultra-Thin Lancets MISC Check blood sugar before meal twice daily Patient not taking: Reported on 04/04/2022 07/31/19   Mack Hook, MD  atorvastatin (LIPITOR) 40 MG tablet TAKE 1 & 1/2 (ONE & ONE-HALF) TABLETS BY MOUTH ONCE DAILY 07/28/22   Mack Hook, MD  Blood Glucose Monitoring Suppl (AGAMATRIX PRESTO) w/Device KIT Check blood sugar twice daily before meals Patient not taking: Reported on 04/04/2022 07/31/19   Mack Hook, MD  gabapentin (NEURONTIN) 100 MG capsule 3 caps by mouth morning and afternoon and 4 to 6 caps by mouth at bedtime 07/28/22   Mack Hook, MD  glipiZIDE (GLUCOTROL) 10 MG tablet 1 tab by mouth twice daily with food 07/28/22   Mack Hook, MD  glucose blood (AGAMATRIX PRESTO TEST) test strip Check blood sugar twice daily before meals Patient not taking: Reported on 04/04/2022 07/31/19   Mack Hook, MD  ibuprofen (IBU) 800 MG tablet Take 1 tablet (800 mg total) by mouth every 8 (eight) hours as needed (PAIN). Patient not taking: Reported on 04/04/2022 05/21/21   Enrique Sack, FNP  insulin  glargine (LANTUS) 100 UNIT/ML Solostar Pen 10 units injected subcutaneously twice daily Patient not taking: Reported on 04/04/2022 03/17/21   Mack Hook, MD  Insulin Pen Needle 31G X 6 MM MISC Use for once daily injection Patient not taking: Reported on 03/17/2021 12/04/19   Mack Hook, MD  levonorgestrel (MIRENA) 20 MCG/24HR IUD 1 each by Intrauterine route once. Placed 07/2017.  States to be removed 2026    [provider]  levothyroxine (SYNTHROID) 150 MCG tablet Take 1 tablet (150 mcg  total) by mouth daily. 07/28/22   Mack Hook, MD  losartan (COZAAR) 50 MG tablet Take 1 tablet (50 mg total) by mouth daily. 07/28/22   Mack Hook, MD  metFORMIN (GLUCOPHAGE) 1000 MG tablet Take 1 tablet (1,000 mg total) by mouth 2 (two) times daily with a meal. 07/28/22   Mack Hook, MD    Family History Family History  Problem Relation Age of Onset   Diabetes Father    Diabetes Sister     Social History Social History   Tobacco Use   Smoking status: Never   Smokeless tobacco: Never  Substance Use Topics   Alcohol use: No   Drug use: Not Currently     Allergies   Ace inhibitors   Review of Systems Review of Systems Per HPI  Physical Exam Triage Vital Signs ED Triage Vitals  Enc Vitals Group     BP 10/06/22 1321 136/80     Pulse Rate 10/06/22 1321 64     Resp 10/06/22 1321 16     Temp 10/06/22 1321 98.2 F (36.8 C)     Temp Source 10/06/22 1321 Oral     SpO2 10/06/22 1321 97 %     Weight --      Height --      Head Circumference --      Peak Flow --      Pain Score 10/06/22 1324 5     Pain Loc --      Pain Edu? --      Excl. in [place]? --    No data found.  Updated Vital Signs BP 136/80 (BP Location: Left Arm)   Pulse 64   Temp 98.2 F (36.8 C) (Oral)   Resp 16   LMP 10/06/2022 (Exact Date)   SpO2 97%   Visual Acuity Right Eye Distance:   Left Eye Distance:   Bilateral Distance:    Right Eye Near:   Left Eye Near:    Bilateral Near:     Physical Exam Vitals and nursing note reviewed.  Constitutional:      Appearance: She is not ill-appearing or toxic-appearing.  HENT:     Head: Normocephalic and atraumatic.     Right Ear: Hearing and external ear normal.     Left Ear: Hearing and external ear normal.     Nose: Nose normal.     Mouth/Throat:     Lips: Pink.  Eyes:     General: Lids are normal. Vision grossly intact. Gaze aligned appropriately.     Extraocular Movements: Extraocular movements intact.      Conjunctiva/sclera: Conjunctivae normal.  Cardiovascular:     Rate and Rhythm: Normal rate and regular rhythm.     Pulses:          Dorsalis pedis pulses are 2+ on the right side and 2+ on the left side.     Heart sounds: Normal heart sounds, S1 normal and S2 normal.  Pulmonary:     Effort:  Pulmonary effort is normal. No respiratory distress.     Breath sounds: Normal breath sounds and air entry.  Musculoskeletal:     Cervical back: Neck supple.       Feet:  Feet:     Right foot:     Skin integrity: Skin integrity normal.     Left foot:     Skin integrity: Erythema present. No ulcer, blister, skin breakdown, warmth, callus, dry skin or fissure.     Comments: +2 bilateral dorsalis pedis pulses with less than 3 capillary refill to bilateral feet.  Strength and sensation intact distally to bilateral feet. Skin:    General: Skin is warm and dry.     Capillary Refill: Capillary refill takes less than 2 seconds.     Findings: No rash.  Neurological:     General: No focal deficit present.     Mental Status: She is alert and oriented to person, place, and time. Mental status is at baseline.     Cranial Nerves: No dysarthria or facial asymmetry.  Psychiatric:        Mood and Affect: Mood normal.        Speech: Speech normal.        Behavior: Behavior normal.        Thought Content: Thought content normal.        Judgment: Judgment normal.      UC Treatments / Results  Labs (all labs ordered are listed, but only abnormal results are displayed) Labs Reviewed - No data to display  EKG   Radiology No results found.  Procedures Procedures (including critical care time)  Medications Ordered in UC Medications - No data to display  Initial Impression / Assessment and Plan / UC Course  I have reviewed the triage vital signs and the nursing notes.  Pertinent labs & imaging results that were available during my care of the patient were reviewed by me and considered in my medical  decision making (see chart for details).   1.  Type 2 diabetes with other skin complication with long-term current use of insulin, left foot pain Left foot erythema does not appear to be cellulitis in nature.  No warmth or spreading erythema.  Erythema has stayed stable and unchanged over the last couple of days.  Stable musculoskeletal exam with atraumatic mechanism of discomfort, therefore deferred imaging of the left foot.  Patient has a history of plantar fasciitis to the right foot but states this has resolved, low suspicion for plantar fasciitis etiology to the left foot.  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   Advised watchful waiting and strict urgent care/ER return precautions. Patient is at increased risk for diabetic foot ulcer and nonhealing infections to the feet, however no indication for antibiotics today based on presentation.  She is agreeable with this plan.  May perform Epsom salt soaks to reduce aches and pains.  May take Tylenol as needed for pain.  Advised follow-up with PCP in the next 1 to 2 weeks.  Discussed physical exam and available lab work findings in clinic with patient.  Counseled patient regarding appropriate use of medications and potential side effects for all medications recommended or prescribed today. Discussed red flag signs and symptoms of worsening condition,when to call the PCP office, return to urgent care, and when to seek higher level of care in the emergency department. Patient  verbalizes understanding and agreement with plan. All questions answered. Patient discharged in stable condition.   Final Clinical Impressions(s) / UC Diagnoses   Final diagnoses:  Left foot pain  Type 2 diabetes mellitus with other skin complication, with long-term current use of insulin     Discharge Instructions      Monitor site of pain  and redness for further signs of infection such as worsening redness, swelling, ulceration, pain, or new numbness/tingling to the toes.  Please continue taking your diabetes medications as prescribed.  Epsom salt soaks may help to reduce inflammation and swelling/pain to the area.  You may take Tylenol 1000 mg every 6 hours as needed for pain.  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  Tylenol and Epsom salt soaks can help with this as well as elevating her legs at the end of a long workday to reduce swelling.  I am so proud of you for lowering your sugars!  Return to urgent care if you notice worsening signs of infection, otherwise you may follow-up with your primary care provider as needed.      ED Prescriptions   None    PDMP not reviewed this encounter.   Talbot Grumbling,  10/06/22 1401

## 2022-10-28 ENCOUNTER — Other Ambulatory Visit: Payer: Self-pay

## 2022-10-28 DIAGNOSIS — E1165 Type 2 diabetes mellitus with hyperglycemia: Secondary | ICD-10-CM

## 2022-10-28 DIAGNOSIS — E039 Hypothyroidism, unspecified: Secondary | ICD-10-CM

## 2022-10-28 DIAGNOSIS — Z79899 Other long term (current) drug therapy: Secondary | ICD-10-CM

## 2022-10-28 DIAGNOSIS — E782 Mixed hyperlipidemia: Secondary | ICD-10-CM

## 2022-10-29 LAB — COMPREHENSIVE METABOLIC PANEL
ALT: 21 IU/L (ref 0–32)
AST: 19 IU/L (ref 0–40)
Albumin/Globulin Ratio: 1.8 (ref 1.2–2.2)
Albumin: 3.8 g/dL — ABNORMAL LOW (ref 3.9–4.9)
Alkaline Phosphatase: 95 IU/L (ref 44–121)
BUN/Creatinine Ratio: 15 (ref 9–23)
BUN: 8 mg/dL (ref 6–24)
Bilirubin Total: 0.5 mg/dL (ref 0.0–1.2)
CO2: 26 mmol/L (ref 20–29)
Calcium: 8.8 mg/dL (ref 8.7–10.2)
Chloride: 103 mmol/L (ref 96–106)
Creatinine, Ser: 0.53 mg/dL — ABNORMAL LOW (ref 0.57–1.00)
Globulin, Total: 2.1 g/dL (ref 1.5–4.5)
Glucose: 183 mg/dL — ABNORMAL HIGH (ref 70–99)
Potassium: 4.2 mmol/L (ref 3.5–5.2)
Sodium: 139 mmol/L (ref 134–144)
Total Protein: 5.9 g/dL — ABNORMAL LOW (ref 6.0–8.5)
eGFR: 116 mL/min/{1.73_m2} (ref >59)

## 2022-10-29 LAB — LIPID PANEL W/O CHOL/HDL RATIO
Cholesterol, Total: 155 mg/dL (ref 100–199)
HDL: 47 mg/dL (ref >39)
LDL Chol Calc (NIH): 90 mg/dL (ref 0–99)
Triglycerides: 99 mg/dL (ref 0–149)
VLDL Cholesterol Cal: 18 mg/dL (ref 5–40)

## 2022-10-29 LAB — TSH: TSH: 2.44 u[IU]/mL (ref 0.450–4.500)

## 2022-10-29 LAB — MICROALBUMIN / CREATININE URINE RATIO
Creatinine, Urine: 84.9 mg/dL
Microalb/Creat Ratio: 83 mg/g creat — ABNORMAL HIGH (ref 0–29)
Microalbumin, Urine: 70.2 ug/mL

## 2022-10-29 LAB — HEMOGLOBIN A1C
Est. average glucose Bld gHb Est-mCnc: 229 mg/dL
Hgb A1c MFr Bld: 9.6 % — ABNORMAL HIGH (ref 4.8–5.6)

## 2022-12-09 ENCOUNTER — Other Ambulatory Visit: Payer: Self-pay

## 2023-01-26 ENCOUNTER — Ambulatory Visit: Payer: Self-pay | Admitting: Internal Medicine

## 2023-02-01 ENCOUNTER — Ambulatory Visit: Payer: Self-pay | Admitting: Internal Medicine

## 2023-02-24 ENCOUNTER — Ambulatory Visit: Payer: Self-pay | Admitting: Family

## 2023-03-05 ENCOUNTER — Ambulatory Visit
Admission: EM | Admit: 2023-03-05 | Discharge: 2023-03-05 | Disposition: A | Discharge status: Home / Self Care | Payer: Self-pay | Attending: Physician Assistant | Admitting: Physician Assistant

## 2023-03-05 DIAGNOSIS — L600 Ingrowing nail: Secondary | ICD-10-CM

## 2023-03-05 DIAGNOSIS — E1140 Type 2 diabetes mellitus with diabetic neuropathy, unspecified: Secondary | ICD-10-CM

## 2023-03-05 MED ORDER — DOXYCYCLINE HYCLATE 100 MG PO CAPS: 100.0000 mg | ORAL_CAPSULE | Freq: Two times a day (BID) | ORAL | 0 refills | Status: DC

## 2023-03-05 NOTE — ED Provider Notes (Signed)
[facility]    CSN: [id number] Arrival date & time: 03/05/23  5427      History   Chief Complaint Chief Complaint  Patient presents with   Toe Pain    HPI [name] is a 47 y.o. female.   Here today for evaluation of the left great toe swelling and drainage.  She reports that a few days ago she noticed swelling in the area with purulent fluid collection which she "popped" and had drainage that was white.  She states she is concerned because she toe continues to be red and she is a diabetic  She has not had fever.  She reports mild decrease sensation to her left toe at baseline and that she did not feel any pain with swelling and drainage.  The history is provided by the patient.  Toe Pain    Past Medical History:  Diagnosis Date   Diabetes mellitus    Hyperlipidemia    Thyroid disease     Patient Active Problem List   Diagnosis Date Noted   Hypertension 07/28/2022   Diabetic peripheral neuropathy (HCC) 06/18/2019   Elevated BP without diagnosis of hypertension 06/18/2019   Vitiligo 01/24/2018   IUD (intrauterine device) in place 03/26/2015   Carpal tunnel syndrome 03/26/2015   Chronic cough 05/13/2011   Elevated liver enzymes 12/14/2010   Obesity 06/25/2007   Non-alcoholic fatty liver disease 06/25/2007   Hypothyroidism 04/09/2007   Diabetes mellitus (HCC) 04/09/2007   Hypercholesterolemia 04/09/2007    History reviewed. No pertinent surgical history.  OB History   No obstetric history on file.      Home Medications    Prior to Admission medications   Medication Sig Start Date End Date Taking? Authorizing Provider  atorvastatin (LIPITOR) 40 MG tablet TAKE 1 & 1/2 (ONE & ONE-HALF) TABLETS BY MOUTH ONCE DAILY 07/28/22  Yes Julieanne Manson, MD  doxycycline (VIBRAMYCIN) 100 MG capsule Take 1 capsule (100 mg total) by mouth 2 (two) times daily. 03/05/23  Yes Tomi Bamberger, PA-C  glipiZIDE (GLUCOTROL) 10 MG tablet 1 tab by mouth  twice daily with food 07/28/22  Yes Julieanne Manson, MD  levonorgestrel (MIRENA) 20 MCG/24HR IUD 1 each by Intrauterine route once. Placed 07/2017.  States to be removed 2026   Yes [provider]  levothyroxine (SYNTHROID) 150 MCG tablet Take 1 tablet (150 mcg total) by mouth daily. 07/28/22  Yes Julieanne Manson, MD  losartan (COZAAR) 50 MG tablet Take 1 tablet (50 mg total) by mouth daily. 07/28/22  Yes Julieanne Manson, MD  metFORMIN (GLUCOPHAGE) 1000 MG tablet Take 1 tablet (1,000 mg total) by mouth 2 (two) times daily with a meal. 07/28/22  Yes Julieanne Manson, MD  AgaMatrix Ultra-Thin Lancets MISC Check blood sugar before meal twice daily Patient not taking: Reported on 04/04/2022 07/31/19   Julieanne Manson, MD  Blood Glucose Monitoring Suppl (AGAMATRIX PRESTO) w/Device KIT Check blood sugar twice daily before meals Patient not taking: Reported on 04/04/2022 07/31/19   Julieanne Manson, MD  gabapentin (NEURONTIN) 100 MG capsule 3 caps by mouth morning and afternoon and 4 to 6 caps by mouth at bedtime 07/28/22   Julieanne Manson, MD  glucose blood (AGAMATRIX PRESTO TEST) test strip Check blood sugar twice daily before meals Patient not taking: Reported on 04/04/2022 07/31/19   Julieanne Manson, MD  ibuprofen (IBU) 800 MG tablet Take 1 tablet (800 mg total) by mouth every 8 (eight) hours as needed (PAIN). Patient not taking: Reported on 04/04/2022 05/21/21  Lurline Idol, FNP  insulin glargine (LANTUS) 100 UNIT/ML Solostar Pen 10 units injected subcutaneously twice daily Patient not taking: Reported on 04/04/2022 03/17/21   Julieanne Manson, MD  Insulin Pen Needle 31G X 6 MM MISC Use for once daily injection Patient not taking: Reported on 03/17/2021 12/04/19   Julieanne Manson, MD    Family History Family History  Problem Relation Age of Onset   Diabetes Father    Diabetes Sister     Social History Social History   Tobacco Use   Smoking status: Never    Smokeless tobacco: Never  Vaping Use   Vaping status: Never Used  Substance Use Topics   Alcohol use: No   Drug use: Not Currently     Allergies   Ace inhibitors   Review of Systems Review of Systems  Constitutional:  Negative for chills and fever.  Eyes:  Negative for discharge and redness.  Gastrointestinal:  Negative for nausea and vomiting.  Skin:  Positive for color change and wound.  Neurological:  Positive for numbness.     Physical Exam Triage Vital Signs ED Triage Vitals  Encounter Vitals Group     BP 03/05/23 1101 134/77     Systolic BP Percentile --      Diastolic BP Percentile --      Pulse Rate 03/05/23 1101 65     Resp 03/05/23 1101 18     Temp 03/05/23 1101 98.1 F (36.7 C)     Temp Source 03/05/23 1101 Oral     SpO2 03/05/23 1101 96 %     Weight 03/05/23 1057 174 lb (78.9 kg)     Height 03/05/23 1057 5\' 2"  (1.575 m)     Head Circumference --      Peak Flow --      Pain Score 03/05/23 1055 0     Pain Loc --      Pain Education --      Exclude from Growth Chart --    No data found.  Updated Vital Signs BP 134/77 (BP Location: Left Arm)   Pulse 65   Temp 98.1 F (36.7 C) (Oral)   Resp 18   Ht 5\' 2"  (1.575 m)   Wt 174 lb (78.9 kg)   LMP 02/09/2023 (Exact Date)   SpO2 96%   BMI 31.83 kg/m      Physical Exam Vitals and nursing note reviewed.  Constitutional:      General: She is not in acute distress.    Appearance: Normal appearance. She is not ill-appearing.  HENT:     Head: Normocephalic and atraumatic.  Eyes:     Conjunctiva/sclera: Conjunctivae normal.  Cardiovascular:     Rate and Rhythm: Normal rate.  Pulmonary:     Effort: Pulmonary effort is normal. No respiratory distress.  Skin:    Capillary Refill: Normal cap refill to left great toe    Comments: Mild swelling and erythema surrounding wound to medial aspect of left great toe adjacent to nail.  No active drainage noted.  Neurological:     Mental Status: She is  alert.     Comments: Gross sensation intact to distal left toe  Psychiatric:        Mood and Affect: Mood normal.        Behavior: Behavior normal.        Thought Content: Thought content normal.      UC Treatments / Results  Labs (all labs ordered are listed, but only abnormal results  are displayed) Labs Reviewed - No data to display  EKG   Radiology No results found.  Procedures Procedures (including critical care time)  Medications Ordered in UC Medications - No data to display  Initial Impression / Assessment and Plan / UC Course  I have reviewed the triage vital signs and the nursing notes.  Pertinent labs & imaging results that were available during my care of the patient were reviewed by me and considered in my medical decision making (see chart for details).    Antibiotics prescribed to cover suspected infected ingrown toenail.  Recommended warm Epsom salt soaks to promote continued drainage. Advised she continue to monitor symptoms and follow-up with any persistent or worsening symptoms specifically given diabetes.  Patient expresses understanding.  Final Clinical Impressions(s) / UC Diagnoses   Final diagnoses:  Ingrowing toenail with infection  Type 2 diabetes mellitus with diabetic neuropathy, without long-term current use of insulin [facility])   Discharge Instructions   None    ED Prescriptions     Medication Sig Dispense Auth. Provider   doxycycline (VIBRAMYCIN) 100 MG capsule Take 1 capsule (100 mg total) by mouth 2 (two) times daily. 20 capsule Tomi Bamberger, PA-C      PDMP not reviewed this encounter.   Tomi Bamberger, PA-C 03/05/23 1141

## 2023-03-05 NOTE — ED Triage Notes (Signed)
"  I have a problem with my left great toe, seems to be infected on the right side of my toenail". Recent Pedicure. No pain. No fever. "I am a diabetic so I am concerned for any infection".

## 2023-03-22 ENCOUNTER — Ambulatory Visit: Payer: Self-pay | Admitting: Internal Medicine

## 2023-03-22 ENCOUNTER — Telehealth: Payer: Self-pay

## 2023-03-22 NOTE — Telephone Encounter (Signed)
Patient's appointment was rescheduled. Patient wants a sooner appointment.  Patient is available any day after 5pm  We will call if there is a cancellation

## 2023-03-23 ENCOUNTER — Ambulatory Visit: Payer: Self-pay | Admitting: Internal Medicine

## 2023-03-24 ENCOUNTER — Other Ambulatory Visit: Payer: Self-pay

## 2023-03-24 DIAGNOSIS — E1165 Type 2 diabetes mellitus with hyperglycemia: Secondary | ICD-10-CM

## 2023-03-25 LAB — HEMOGLOBIN A1C
Est. average glucose Bld gHb Est-mCnc: 243 mg/dL
Hgb A1c MFr Bld: 10.1 % — ABNORMAL HIGH (ref 4.8–5.6)

## 2023-04-03 NOTE — Telephone Encounter (Signed)
Patient has been scheduled

## 2023-04-05 ENCOUNTER — Ambulatory Visit: Payer: Self-pay | Admitting: Internal Medicine

## 2023-04-05 ENCOUNTER — Encounter: Payer: Self-pay | Admitting: Internal Medicine

## 2023-04-05 VITALS — BP 134/80 | HR 68 | Resp 16 | Ht 62.5 in | Wt 179.0 lb

## 2023-04-05 DIAGNOSIS — E782 Mixed hyperlipidemia: Secondary | ICD-10-CM | POA: Diagnosis not present

## 2023-04-05 DIAGNOSIS — E1165 Type 2 diabetes mellitus with hyperglycemia: Secondary | ICD-10-CM

## 2023-04-05 DIAGNOSIS — E039 Hypothyroidism, unspecified: Secondary | ICD-10-CM | POA: Diagnosis not present

## 2023-04-05 DIAGNOSIS — I10 Essential (primary) hypertension: Secondary | ICD-10-CM

## 2023-04-05 DIAGNOSIS — Z6832 Body mass index (BMI) 32.0-32.9, adult: Secondary | ICD-10-CM

## 2023-04-05 DIAGNOSIS — E66811 Obesity, class 1: Secondary | ICD-10-CM

## 2023-04-05 DIAGNOSIS — E1142 Type 2 diabetes mellitus with diabetic polyneuropathy: Secondary | ICD-10-CM

## 2023-04-05 MED ORDER — EMPAGLIFLOZIN 10 MG PO TABS: 10.0000 mg | ORAL_TABLET | Freq: Every day | ORAL | 11 refills | Status: DC

## 2023-04-05 MED ORDER — GABAPENTIN 100 MG PO CAPS: ORAL_CAPSULE | ORAL | 11 refills | Status: DC

## 2023-04-05 MED ORDER — TERBINAFINE HCL 1 % EX CREA: 1.0000 | TOPICAL_CREAM | Freq: Two times a day (BID) | CUTANEOUS | Status: AC

## 2023-04-05 MED ORDER — OZEMPIC (0.25 OR 0.5 MG/DOSE) 2 MG/3ML ~~LOC~~ SOPN: 0.2500 mg | PEN_INJECTOR | SUBCUTANEOUS | 6 refills | Status: DC

## 2023-04-05 NOTE — Progress Notes (Signed)
Subjective:    Patient ID: [name], female   DOB: 1975/09/19, 47 y.o.   MRN: [id number]   HPI   Left great ingrown toenail:  Much improved.  Describes having a paronychia that she opened herself and drained.  She would like to be seen by podiatry, but does not have an orange card.  Not clear if her left great toenail with discoloration.  2.  DM: A1C was down to 9.6% in April and now back up to 10.1%.  States in spring, she had cut back her carbs.  Her TSH was also down to 2.44.    3.  Hypothyroidism:  States continues to taken levothyroxine regularly.    4.  Hypertension:  Losartan.    5.  Hyperlipidemia:  was enormously improved in April after surging in January, but was extremely hypothyroid then as well.    Current Meds  Medication Sig   atorvastatin (LIPITOR) 40 MG tablet TAKE 1 & 1/2 (ONE & ONE-HALF) TABLETS BY MOUTH ONCE DAILY   glipiZIDE (GLUCOTROL) 10 MG tablet 1 tab by mouth twice daily with food   levonorgestrel (MIRENA) 20 MCG/24HR IUD 1 each by Intrauterine route once. Placed 07/2017.  States to be removed 2026   levothyroxine (SYNTHROID) 150 MCG tablet Take 1 tablet (150 mcg total) by mouth daily.   losartan (COZAAR) 50 MG tablet Take 1 tablet (50 mg total) by mouth daily.   metFORMIN (GLUCOPHAGE) 1000 MG tablet Take 1 tablet (1,000 mg total) by mouth 2 (two) times daily with a meal.    Allergies  Allergen Reactions   Ace Inhibitors Cough    Per history and chart review 2012.     Review of Systems    Objective:   BP 134/80 (BP Location: Right Arm, Patient Position: Sitting, Cuff Size: Normal)   Pulse 68   Resp 16   Ht 5' 2.5" (1.588 m)   Wt 179 lb (81.2 kg)   BMI 32.22 kg/m   Physical Exam HENT:     Right Ear: Tympanic membrane normal.     Left Ear: Tympanic membrane normal.     Mouth/Throat:     Mouth: Mucous membranes are moist.     Pharynx: Oropharynx is clear.  Eyes:     Extraocular Movements: Extraocular movements intact.      Pupils: Pupils are equal, round, and reactive to light.  Cardiovascular:     Rate and Rhythm: Normal rate and regular rhythm.     Pulses: Normal pulses.     Heart sounds: No murmur heard.    No friction rub.  Pulmonary:     Effort: Pulmonary effort is normal.     Breath sounds: Normal breath sounds.  Musculoskeletal:     Cervical back: Normal range of motion and neck supple.     Right lower leg: No edema.     Left lower leg: No edema.  Feet:     Comments: Great toenail with mild discoloration distally.  No surrounding erythema, but some crusting at edge of nail Skin:    General: Skin is warm.      Assessment & Plan   Perhaps mild fungal involvement of distal edge of great toenail.  Will just use topical terbinafine twice daily for 14 + days to control  2.  DM:  Not controlled.  Add Jardiance 10 mg in morning and Ozempic 0.25 mg once weekly.  Check glucose twice daily before meals.  Weight check and list of blood sugars  in 2 months.    3.  Hypothyroidism:  adequately replaced.    4.  Hypertension:  controlled  5.  Hyperlipidemia:  Improved control in April.  FLP with follow up in 3-4 months.  6.  Peripheral neuropathy:  Start gabapentin. Titrate to 300 mg 3 times daily or until symptoms relieved.

## 2023-04-05 NOTE — Patient Instructions (Signed)
Gabapentin: Start taking Gabapentin 100 mg cap 1 cap at bedtime. In 3 days, increase to 2 caps at bedtime. In another 3 days, increase to 3 caps at bedtime You should be taking 3 caps at bedtime at this point.  In 3 days, start another 1 cap in the morning In 3 more days, increase to 2 caps in the morning In 3 more days, increase to 3 caps in the morning:  You should be taking 3 caps in the morning and 3 caps at bedtime at this point.  In 3 days, start another dose midday--1 cap In 3 more days, increase to 2 caps midday In 3 more days, increase to 3 caps midday. You should be taking 3 caps 3 times daily at this point.  Stay on this dose until follow up If you do not tolerate increasing the dose at any point, hold on the dose you tolerate or call clinic if having problems 

## 2023-04-21 ENCOUNTER — Telehealth: Payer: Self-pay | Admitting: Internal Medicine

## 2023-04-21 NOTE — Telephone Encounter (Signed)
Dawn from the [facility] called stating the patient went over to apply for the [name] Program and also stated that Dr. Delrae Alfred had put her back on Lantus but the pharmacy has not received the Rx from Dr. Delrae Alfred. Dawn wanted to check with Dr. Delrae Alfred if is was okay to give patient some samples for now.   Discussed with Dr. Delrae Alfred and patient was not prescribed Lantus, instead Dr. Delrae Alfred prescribed Semaglutide and sent Rx [facility] along with [facility]. Dawn and patient have been informed.

## 2023-06-07 ENCOUNTER — Other Ambulatory Visit: Payer: Self-pay

## 2023-06-07 NOTE — Progress Notes (Signed)
Patient forgot to bring in noted blood sugars. Patient does not have any questions or concerns.   Return in 2 weeks for weight check and sugars.

## 2023-06-21 ENCOUNTER — Other Ambulatory Visit: Payer: Self-pay

## 2023-07-20 ENCOUNTER — Other Ambulatory Visit: Payer: Self-pay | Admitting: Internal Medicine

## 2023-07-25 ENCOUNTER — Ambulatory Visit: Payer: Self-pay | Admitting: Internal Medicine

## 2023-07-30 ENCOUNTER — Ambulatory Visit
Admission: EM | Admit: 2023-07-30 | Discharge: 2023-07-30 | Disposition: A | Discharge status: Home / Self Care | Payer: Self-pay

## 2023-07-30 ENCOUNTER — Encounter: Payer: Self-pay | Admitting: Emergency Medicine

## 2023-07-30 DIAGNOSIS — K047 Periapical abscess without sinus: Secondary | ICD-10-CM

## 2023-07-30 MED ORDER — CLARITHROMYCIN 500 MG PO TABS: 500.0000 mg | ORAL_TABLET | Freq: Two times a day (BID) | ORAL | 0 refills | Status: AC

## 2023-07-30 MED ORDER — KETOROLAC TROMETHAMINE 30 MG/ML IJ SOLN
30.0000 mg | Freq: Once | INTRAMUSCULAR | Status: AC
  Administered 2023-07-30: 30 mg via INTRAMUSCULAR

## 2023-07-30 NOTE — ED Provider Notes (Signed)
[facility]    CSN: [id number] Arrival date & time: 07/30/23  1914      History   Chief Complaint Chief Complaint  Patient presents with   Dental Pain    HPI [name] is a 48 y.o. female.   Patient here today for evaluation of dental infection she started amoxicillin for 5 days ago. She reports pain is more severe and she has taken ibuprofen without resolution. Last dose of ibuprofen was 1 am this morning. She has dentist appointment scheduled tomorrow.   The history is provided by the patient.  Dental Pain Associated symptoms: no fever     Past Medical History:  Diagnosis Date   Diabetes mellitus    Hyperlipidemia    Thyroid disease     Patient Active Problem List   Diagnosis Date Noted   Uncontrolled type 2 diabetes mellitus with hyperglycemia, without long-term current use of insulin (HCC) 04/05/2023   Mixed hyperlipidemia 04/05/2023   Hypertension 07/28/2022   Diabetic peripheral neuropathy (HCC) 06/18/2019   Elevated BP without diagnosis of hypertension 06/18/2019   Vitiligo 01/24/2018   IUD (intrauterine device) in place 03/26/2015   Carpal tunnel syndrome 03/26/2015   Chronic cough 05/13/2011   Elevated liver enzymes 12/14/2010   Obesity 06/25/2007   Non-alcoholic fatty liver disease 06/25/2007   Hypothyroidism 04/09/2007   Diabetes mellitus (HCC) 04/09/2007   Hypercholesterolemia 04/09/2007    History reviewed. No pertinent surgical history.  OB History   No obstetric history on file.      Home Medications    Prior to Admission medications   Medication Sig Start Date End Date Taking? Authorizing Provider  atorvastatin (LIPITOR) 40 MG tablet TAKE 1 & 1/2 (ONE & ONE-HALF) TABLETS BY MOUTH ONCE DAILY 07/28/22  Yes Julieanne Manson, MD  clarithromycin (BIAXIN) 500 MG tablet Take 1 tablet (500 mg total) by mouth 2 (two) times daily for 7 days. 07/30/23 08/06/23 Yes Tomi Bamberger, PA-C  empagliflozin (JARDIANCE) 10 MG  TABS tablet Take 1 tablet (10 mg total) by mouth daily before breakfast. 04/05/23  Yes Julieanne Manson, MD  glipiZIDE (GLUCOTROL) 10 MG tablet Take 1 tablet by mouth twice daily with food 07/21/23  Yes Julieanne Manson, MD  levothyroxine (SYNTHROID) 150 MCG tablet Take 1 tablet by mouth once daily 07/21/23  Yes Julieanne Manson, MD  losartan (COZAAR) 50 MG tablet Take 1 tablet by mouth once daily 07/21/23  Yes Julieanne Manson, MD  metFORMIN (GLUCOPHAGE) 1000 MG tablet TAKE 1 TABLET BY MOUTH TWICE DAILY WITH A MEAL 07/21/23  Yes Julieanne Manson, MD  Semaglutide,0.25 or 0.5MG /DOS, (OZEMPIC, 0.25 OR 0.5 MG/DOSE,) 2 MG/3ML SOPN Inject 0.25 mg into the skin once a week. 04/05/23  Yes Julieanne Manson, MD  AgaMatrix Ultra-Thin Lancets MISC Check blood sugar before meal twice daily Patient not taking: Reported on 04/04/2022 07/31/19   Julieanne Manson, MD  Blood Glucose Monitoring Suppl (AGAMATRIX PRESTO) w/Device KIT Check blood sugar twice daily before meals Patient not taking: Reported on 04/04/2022 07/31/19   Julieanne Manson, MD  gabapentin (NEURONTIN) 100 MG capsule 1 cap by mouth at bedtime and titrate to 3 caps 3 times daily following written instructions in handout 04/05/23   Julieanne Manson, MD  glucose blood (AGAMATRIX PRESTO TEST) test strip Check blood sugar twice daily before meals Patient not taking: Reported on 04/04/2022 07/31/19   Julieanne Manson, MD  ibuprofen (IBU) 800 MG tablet Take 1 tablet (800 mg total) by mouth every 8 (eight) hours as needed (PAIN).  Patient not taking: Reported on 04/04/2022 05/21/21   Lurline Idol, FNP  insulin glargine (LANTUS) 100 UNIT/ML Solostar Pen 10 units injected subcutaneously twice daily Patient not taking: Reported on 04/04/2022 03/17/21   Julieanne Manson, MD  Insulin Pen Needle 31G X 6 MM MISC Use for once daily injection Patient not taking: Reported on 03/17/2021 12/04/19   Julieanne Manson, MD  levonorgestrel (MIRENA)  20 MCG/24HR IUD 1 each by Intrauterine route once. Placed 07/2017.  States to be removed 2026    [provider]  terbinafine (LAMISIL) 1 % cream Apply 1 Application topically 2 (two) times daily. 04/05/23   Julieanne Manson, MD    Family History Family History  Problem Relation Age of Onset   Diabetes Father    Diabetes Sister     Social History Social History   Tobacco Use   Smoking status: Never    Passive exposure: Never   Smokeless tobacco: Never  Vaping Use   Vaping status: Never Used  Substance Use Topics   Alcohol use: No   Drug use: Not Currently     Allergies   Ace inhibitors   Review of Systems Review of Systems  Constitutional:  Negative for chills and fever.  HENT:  Positive for dental problem.   Eyes:  Negative for discharge and redness.  Respiratory:  Negative for shortness of breath.   Gastrointestinal:  Negative for abdominal pain, nausea and vomiting.     Physical Exam Triage Vital Signs ED Triage Vitals  Encounter Vitals Group     BP 07/30/23 0916 (!) 149/88     Systolic BP Percentile --      Diastolic BP Percentile --      Pulse Rate 07/30/23 0916 80     Resp 07/30/23 0916 18     Temp 07/30/23 0916 98.3 F (36.8 C)     Temp Source 07/30/23 0916 Oral     SpO2 07/30/23 0916 95 %     Weight 07/30/23 0913 175 lb 14.8 oz (79.8 kg)     Height 07/30/23 0913 5' 2.5" (1.588 m)     Head Circumference --      Peak Flow --      Pain Score 07/30/23 0911 10     Pain Loc --      Pain Education --      Exclude from Growth Chart --    No data found.  Updated Vital Signs BP (!) 149/88 (BP Location: Left Arm)   Pulse 80   Temp 98.3 F (36.8 C) (Oral)   Resp 18   Ht 5' 2.5" (1.588 m)   Wt 175 lb 14.8 oz (79.8 kg)   LMP 06/01/2023 (Approximate)   SpO2 95%   BMI 31.66 kg/m   Visual Acuity Right Eye Distance:   Left Eye Distance:   Bilateral Distance:    Right Eye Near:   Left Eye Near:    Bilateral Near:     Physical  Exam Vitals and nursing note reviewed.  Constitutional:      General: She is not in acute distress.    Appearance: Normal appearance. She is not ill-appearing.  HENT:     Head: Normocephalic and atraumatic.     Mouth/Throat:     Comments: Gingival swelling and erythema noted to left posterior lower molars Eyes:     Conjunctiva/sclera: Conjunctivae normal.  Cardiovascular:     Rate and Rhythm: Normal rate.  Pulmonary:     Effort: Pulmonary effort is normal.  Neurological:     Mental Status: She is alert.  Psychiatric:        Mood and Affect: Mood normal.        Behavior: Behavior normal.        Thought Content: Thought content normal.      UC Treatments / Results  Labs (all labs ordered are listed, but only abnormal results are displayed) Labs Reviewed - No data to display  EKG   Radiology No results found.  Procedures Procedures (including critical care time)  Medications Ordered in UC Medications  ketorolac (TORADOL) 30 MG/ML injection 30 mg (has no administration in time range)    Initial Impression / Assessment and Plan / UC Course  I have reviewed the triage vital signs and the nursing notes.  Pertinent labs & imaging results that were available during my care of the patient were reviewed by me and considered in my medical decision making (see chart for details).    Will change amoxicillin to clarithromycin and toradol injection administered in office. Advised she keep appointment with dentist tomorrow. Patient expresses understanding.   Final Clinical Impressions(s) / UC Diagnoses   Final diagnoses:  Dental abscess     Discharge Instructions      No ibuprofen/ naproxen for remainder of today.   Please hold statin medication while taking antibiotic.   Follow up with dentist as scheduled.    ED Prescriptions     Medication Sig Dispense Auth. Provider   clarithromycin (BIAXIN) 500 MG tablet Take 1 tablet (500 mg total) by mouth 2 (two) times  daily for 7 days. 14 tablet Tomi Bamberger, PA-C      PDMP not reviewed this encounter.   Tomi Bamberger, PA-C 07/30/23 [phone]

## 2023-07-30 NOTE — Discharge Instructions (Signed)
No ibuprofen/ naproxen for remainder of today.   Please hold statin medication while taking antibiotic.   Follow up with dentist as scheduled.

## 2023-07-30 NOTE — ED Triage Notes (Signed)
Pt states she discovered an infection in tooth at a previous Dentist appt. Pt says she was prescribed an antibiotic on 21st that she has not yet finished and she is in excruciating pain. She has tried several OTC products and none of them are working.

## 2023-08-04 ENCOUNTER — Other Ambulatory Visit: Payer: Self-pay

## 2023-08-04 DIAGNOSIS — E039 Hypothyroidism, unspecified: Secondary | ICD-10-CM

## 2023-08-04 DIAGNOSIS — E782 Mixed hyperlipidemia: Secondary | ICD-10-CM | POA: Diagnosis not present

## 2023-08-04 DIAGNOSIS — E1165 Type 2 diabetes mellitus with hyperglycemia: Secondary | ICD-10-CM

## 2023-08-05 LAB — LIPID PANEL W/O CHOL/HDL RATIO
Cholesterol, Total: 205 mg/dL — ABNORMAL HIGH (ref 100–199)
HDL: 39 mg/dL — ABNORMAL LOW (ref >39)
LDL Chol Calc (NIH): 145 mg/dL — ABNORMAL HIGH (ref 0–99)
Triglycerides: 118 mg/dL (ref 0–149)
VLDL Cholesterol Cal: 21 mg/dL (ref 5–40)

## 2023-08-05 LAB — MICROALBUMIN / CREATININE URINE RATIO
Creatinine, Urine: 51.2 mg/dL
Microalb/Creat Ratio: 176 mg/g{creat} — ABNORMAL HIGH (ref 0–29)
Microalbumin, Urine: 90 ug/mL

## 2023-08-05 LAB — HEMOGLOBIN A1C
Est. average glucose Bld gHb Est-mCnc: 246 mg/dL
Hgb A1c MFr Bld: 10.2 % — ABNORMAL HIGH (ref 4.8–5.6)

## 2023-08-05 LAB — TSH: TSH: 0.939 u[IU]/mL (ref 0.450–4.500)

## 2023-08-16 ENCOUNTER — Encounter: Payer: Self-pay | Admitting: Internal Medicine

## 2023-08-16 ENCOUNTER — Ambulatory Visit: Payer: Self-pay | Admitting: Internal Medicine

## 2023-08-16 VITALS — BP 124/82 | HR 72 | Resp 16 | Ht 62.5 in | Wt 173.0 lb

## 2023-08-16 DIAGNOSIS — E1165 Type 2 diabetes mellitus with hyperglycemia: Secondary | ICD-10-CM | POA: Diagnosis not present

## 2023-08-16 DIAGNOSIS — I10 Essential (primary) hypertension: Secondary | ICD-10-CM | POA: Diagnosis not present

## 2023-08-16 DIAGNOSIS — E782 Mixed hyperlipidemia: Secondary | ICD-10-CM

## 2023-08-16 DIAGNOSIS — E1142 Type 2 diabetes mellitus with diabetic polyneuropathy: Secondary | ICD-10-CM

## 2023-08-16 DIAGNOSIS — E039 Hypothyroidism, unspecified: Secondary | ICD-10-CM

## 2023-08-16 MED ORDER — OZEMPIC (0.25 OR 0.5 MG/DOSE) 2 MG/3ML ~~LOC~~ SOPN: PEN_INJECTOR | SUBCUTANEOUS | 6 refills | Status: DC

## 2023-08-16 NOTE — Progress Notes (Unsigned)
    Subjective:    Patient ID: [name], female   DOB: Feb 11, 1976, 48 y.o.   MRN: [id number]   HPI   DM:  Not checking sugars regularly.  Two days ago in the morning, sugar was above 200.  States she is using Ozempic every week, except one week in January when dealing with dental pain--went into Urgent care.  States she is not missing any of there oral meds as well.   Not clear if her diet is worse.    2.  Poeripheral neuro:  never filled gabapentin:  walking at work has diecreased her symptoms and no longer needs.  3.  Hyperlipidemia:  cholesterol back up. States she is taking 1.5 of the atorvastatin daily.  Current Meds  Medication Sig   atorvastatin (LIPITOR) 40 MG tablet TAKE 1 & 1/2 (ONE & ONE-HALF) TABLETS BY MOUTH ONCE DAILY   empagliflozin (JARDIANCE) 10 MG TABS tablet Take 1 tablet (10 mg total) by mouth daily before breakfast.   glipiZIDE (GLUCOTROL) 10 MG tablet Take 1 tablet by mouth twice daily with food   levothyroxine (SYNTHROID) 150 MCG tablet Take 1 tablet by mouth once daily   losartan (COZAAR) 50 MG tablet Take 1 tablet by mouth once daily   metFORMIN (GLUCOPHAGE) 1000 MG tablet TAKE 1 TABLET BY MOUTH TWICE DAILY WITH A MEAL   Semaglutide,0.25 or 0.5MG /DOS, (OZEMPIC, 0.25 OR 0.5 MG/DOSE,) 2 MG/3ML SOPN Inject 0.25 mg into the skin once a week.   terbinafine (LAMISIL) 1 % cream Apply 1 Application topically 2 (two) times daily.   Allergies  Allergen Reactions   Ace Inhibitors Cough    Per history and chart review 2012.     Review of Systems    Objective:   BP 124/82 (BP Location: Right Arm, Patient Position: Sitting)   Pulse 72   Resp 16   Ht 5' 2.5" (1.588 m)   Wt 173 lb (78.5 kg)   LMP 07/01/2023 (Approximate)   SpO2 99%   BMI 31.14 kg/m   Physical Exam   Assessment & Plan

## 2023-08-18 ENCOUNTER — Other Ambulatory Visit: Payer: Self-pay

## 2023-08-18 MED ORDER — AGAMATRIX PRESTO TEST VI STRP: ORAL_STRIP | 11 refills | Status: DC

## 2023-08-18 MED ORDER — AGAMATRIX PRESTO W/DEVICE KIT: PACK | 0 refills | Status: AC

## 2023-08-18 MED ORDER — AGAMATRIX ULTRA-THIN LANCETS MISC: 11 refills | Status: DC

## 2023-09-13 ENCOUNTER — Other Ambulatory Visit: Payer: Self-pay

## 2023-09-13 NOTE — Progress Notes (Unsigned)
 Patient reports she is constipation for 4 weeks Asked if due to medication increase reports no.   Has been trying metamucil but no relief.  States she will try miralax to see if that helps.  [facility] [address]  Gave Dr. Delrae Alfred sugar levels with morning ranging from low 120's - low 200's  The evening ranges are low 80's - low 170's   Per Dr. Delrae Alfred- Confirm medications.   No changes with medications Recheck weight and bring sugar levels to next lab visit.  Keep appt on 5/15 Reports miralax will   Spoke with patient and she confirms medications and reports taking medication as prescribed.  Will keep lab appt on 11/16/2023 and will bring in sugar levels to appt.   States she has not picked up miralax but will get it soon.

## 2023-09-16 ENCOUNTER — Other Ambulatory Visit: Payer: Self-pay | Admitting: Internal Medicine

## 2023-11-16 ENCOUNTER — Other Ambulatory Visit: Payer: Self-pay

## 2023-11-16 VITALS — Wt 174.0 lb

## 2023-11-16 DIAGNOSIS — E1165 Type 2 diabetes mellitus with hyperglycemia: Secondary | ICD-10-CM

## 2023-11-16 DIAGNOSIS — E782 Mixed hyperlipidemia: Secondary | ICD-10-CM | POA: Diagnosis not present

## 2023-11-16 MED ORDER — SEMAGLUTIDE (1 MG/DOSE) 4 MG/3ML ~~LOC~~ SOPN: 1.0000 mg | PEN_INJECTOR | SUBCUTANEOUS | 11 refills | Status: DC

## 2023-11-16 NOTE — Progress Notes (Addendum)
 Patient came in for weight check and sugar log.  Reports taking four medication for DM

## 2023-11-16 NOTE — Progress Notes (Signed)
 Per Dr. Jayne Mews:   Increase Ozempic  to 1 mg weekly. Check to see how many doses of 0.5 mg left--if several, have her give herself 2 doses at same time weekly until out. Call if sugars drop below 100 regularly and will wean glipizide . Call if any sugar drops below 70 and decrease glipizide  to 1/2 tab or 5 mg twice daily A1C with next visit. Return in 2 weeks for weigh in and bring sugars

## 2023-11-16 NOTE — Addendum Note (Signed)
 Addended by: Amalea Ottey, [place] N on: 11/16/2023 04:14 PM   Modules accepted: Orders

## 2023-11-17 LAB — LIPID PANEL W/O CHOL/HDL RATIO
Cholesterol, Total: 161 mg/dL (ref 100–199)
HDL: 56 mg/dL (ref >39)
LDL Chol Calc (NIH): 86 mg/dL (ref 0–99)
Triglycerides: 106 mg/dL (ref 0–149)
VLDL Cholesterol Cal: 19 mg/dL (ref 5–40)

## 2023-11-17 LAB — HEMOGLOBIN A1C
Est. average glucose Bld gHb Est-mCnc: 192 mg/dL
Hgb A1c MFr Bld: 8.3 % — ABNORMAL HIGH (ref 4.8–5.6)

## 2023-11-29 ENCOUNTER — Telehealth: Payer: Self-pay

## 2023-11-29 MED ORDER — SEMAGLUTIDE (1 MG/DOSE) 4 MG/3ML ~~LOC~~ SOPN: 1.0000 mg | PEN_INJECTOR | SUBCUTANEOUS | 11 refills | Status: DC

## 2023-11-29 MED ORDER — EMPAGLIFLOZIN 10 MG PO TABS: 10.0000 mg | ORAL_TABLET | Freq: Every day | ORAL | 11 refills | Status: AC

## 2023-11-29 NOTE — Telephone Encounter (Signed)
 Spoke with patient and reports she has new insurance and would like rx sent to [facility] [address]

## 2023-11-29 NOTE — Telephone Encounter (Signed)
 Patient left a voicemail that states  Patient would like for her medication Ozempic  and Jardiance  to be sent the  [facility] pharmacy that is on her file.

## 2023-11-29 NOTE — Telephone Encounter (Signed)
 done

## 2023-11-30 ENCOUNTER — Other Ambulatory Visit: Payer: Self-pay

## 2023-11-30 MED ORDER — AGAMATRIX ULTRA-THIN LANCETS MISC: 11 refills | Status: AC

## 2023-12-04 ENCOUNTER — Other Ambulatory Visit: Payer: Self-pay

## 2023-12-04 MED ORDER — AGAMATRIX PRESTO TEST VI STRP: ORAL_STRIP | 11 refills | Status: AC

## 2023-12-06 ENCOUNTER — Other Ambulatory Visit: Payer: Self-pay

## 2023-12-08 ENCOUNTER — Ambulatory Visit: Payer: Self-pay | Admitting: Internal Medicine

## 2024-01-02 ENCOUNTER — Telehealth: Payer: Self-pay | Admitting: Internal Medicine

## 2024-01-09 ENCOUNTER — Encounter: Payer: Self-pay | Admitting: Internal Medicine

## 2024-01-09 NOTE — Telephone Encounter (Incomplete)
 Called to give ICD10 code of E11.69  or E11.65:  Uncontrolled type 2 DM with hyperglycemia and unspecified complications or Uncontrolled Type 2 DM with hyperglycemia respectively.  The latter processed the prescription.

## 2024-01-31 NOTE — Telephone Encounter (Signed)
 Called patient notified Rx should go through.  Patient states Pharmacy contacted her and let her know medication was ready.

## 2024-02-15 ENCOUNTER — Encounter: Payer: Self-pay | Admitting: Internal Medicine

## 2024-02-15 ENCOUNTER — Ambulatory Visit (INDEPENDENT_AMBULATORY_CARE_PROVIDER_SITE_OTHER): Payer: Self-pay | Admitting: Internal Medicine

## 2024-02-15 ENCOUNTER — Other Ambulatory Visit: Payer: Self-pay | Admitting: Internal Medicine

## 2024-02-15 VITALS — BP 112/60 | HR 79 | Resp 17 | Ht 62.0 in | Wt 171.0 lb

## 2024-02-15 DIAGNOSIS — M65312 Trigger thumb, left thumb: Secondary | ICD-10-CM

## 2024-02-15 DIAGNOSIS — Z124 Encounter for screening for malignant neoplasm of cervix: Secondary | ICD-10-CM

## 2024-02-15 DIAGNOSIS — M65311 Trigger thumb, right thumb: Secondary | ICD-10-CM | POA: Diagnosis not present

## 2024-02-15 DIAGNOSIS — I10 Essential (primary) hypertension: Secondary | ICD-10-CM

## 2024-02-15 DIAGNOSIS — E1165 Type 2 diabetes mellitus with hyperglycemia: Secondary | ICD-10-CM

## 2024-02-15 DIAGNOSIS — M7989 Other specified soft tissue disorders: Secondary | ICD-10-CM | POA: Diagnosis not present

## 2024-02-15 DIAGNOSIS — G5603 Carpal tunnel syndrome, bilateral upper limbs: Secondary | ICD-10-CM

## 2024-02-15 DIAGNOSIS — L80 Vitiligo: Secondary | ICD-10-CM

## 2024-02-15 DIAGNOSIS — Z79899 Other long term (current) drug therapy: Secondary | ICD-10-CM | POA: Diagnosis not present

## 2024-02-15 DIAGNOSIS — E1142 Type 2 diabetes mellitus with diabetic polyneuropathy: Secondary | ICD-10-CM

## 2024-02-15 DIAGNOSIS — E039 Hypothyroidism, unspecified: Secondary | ICD-10-CM

## 2024-02-15 DIAGNOSIS — Z0000 Encounter for general adult medical examination without abnormal findings: Secondary | ICD-10-CM

## 2024-02-15 DIAGNOSIS — M65342 Trigger finger, left ring finger: Secondary | ICD-10-CM

## 2024-02-15 DIAGNOSIS — M65341 Trigger finger, right ring finger: Secondary | ICD-10-CM

## 2024-02-15 DIAGNOSIS — Z1231 Encounter for screening mammogram for malignant neoplasm of breast: Secondary | ICD-10-CM

## 2024-02-15 DIAGNOSIS — E782 Mixed hyperlipidemia: Secondary | ICD-10-CM

## 2024-02-15 NOTE — Progress Notes (Unsigned)
 Subjective:    Patient ID: [name], female   DOB: 1976-02-24, 48 y.o.   MRN: [id number]   HPI  CPE with pap  1.  Pap:  Has not been performed since 2018 when IUD inserted at [facility]  States was normal.  Always normal in past.  2.  Mammogram:  Never.  No family history of breast cancer.    3.  Osteoprevention:  yogurt once daily.  Not outside much nor physically active.    4.  Guaiac Cards/FIT:  Never.    5.  Colonoscopy:  Never.  No family history of colon cancer.    6.  Immunizations:  Has not had Pneumococcal 20. Immunization History  Administered Date(s) Administered   Influenza Split 06/16/2011   Influenza Whole 04/09/2007   Influenza,inj,Quad PF,6+ Mos 08/21/2014   Influenza-Unspecified 04/01/2020, 03/24/2023   PFIZER(Purple Top)SARS-COV-2 Vaccination 09/25/2019, 10/16/2019, 05/22/2020   Pneumococcal Polysaccharide-23 08/21/2014   Td 03/04/2002   Tdap 08/21/2014, 01/01/2023     7.  Glucose/Cholesterol:  Last A1C best she has ever had at 8.3% in May.  Has lost only about 4 lbs with Ozempic .  She has not followed up with sugar logs as recommended.  Remains on Glipizide  and Metformin  as well as Jardiance .   Cholesterol panel also much improved, though LDL still a bit above goal of 70 at 86. Lipid Panel     Component Value Date/Time   CHOL 161 11/16/2023 0831   TRIG 106 11/16/2023 0831   HDL 56 11/16/2023 0831   CHOLHDL 3.6 09/03/2015 1619   VLDL 43 (H) 09/03/2015 1619   LDLCALC 86 11/16/2023 0831   LDLDIRECT 125 (H) 09/08/2008 2046   LABVLDL 19 11/16/2023 0831     Current Meds  Medication Sig   AgaMatrix Ultra-Thin Lancets MISC Check blood sugar before meal twice daily   atorvastatin  (LIPITOR) 40 MG tablet TAKE 1 & 1/2 (ONE & ONE-HALF) TABLETS BY MOUTH ONCE DAILY   Blood Glucose Monitoring Suppl (AGAMATRIX PRESTO) w/Device KIT Check blood sugar twice daily before meals   empagliflozin  (JARDIANCE ) 10 MG TABS tablet Take 1 tablet (10 mg total)  by mouth daily before breakfast.   glipiZIDE  (GLUCOTROL ) 10 MG tablet Take 1 tablet by mouth twice daily with food   glucose blood (AGAMATRIX PRESTO TEST) test strip Check blood sugar twice daily before meals   Insulin  Pen Needle 31G X 6 MM MISC Use for once daily injection   levonorgestrel (MIRENA) 20 MCG/24HR IUD 1 each by Intrauterine route once. Placed 07/2017.  States to be removed 2026   levothyroxine  (SYNTHROID ) 150 MCG tablet Take 1 tablet by mouth once daily   losartan  (COZAAR ) 50 MG tablet Take 1 tablet by mouth once daily   metFORMIN  (GLUCOPHAGE ) 1000 MG tablet TAKE 1 TABLET BY MOUTH TWICE DAILY WITH A MEAL   Semaglutide , 1 MG/DOSE, 4 MG/3ML SOPN Inject 1 mg into the skin once a week.   Allergies  Allergen Reactions   Ace Inhibitors Cough    Per history and chart review 2012.   Past Medical History:  Diagnosis Date   Diabetes mellitus    Hyperlipidemia    Thyroid disease    Vitiligo    Past Surgical History:  Procedure Laterality Date   CESAREAN SECTION  2011   Family History  Problem Relation Age of Onset   Alcohol abuse Mother    Alcohol abuse Father    Diabetes Father    Cirrhosis Father    Kidney disease  Sister        ESRD on dialysis at time of death   Hyperlipidemia Sister    Hypertension Sister    Diabetes Sister        complications cause of death   Vision loss Sister        DM retinopathy   Pneumonia Brother       Review of Systems  HENT:  Negative for dental problem.   Eyes:  Negative for visual disturbance (Has not been back to Dr. Zamora for retinal disease for some time.  Goes to [facility]).  Respiratory:  Negative for shortness of breath.   Cardiovascular:  Negative for chest pain, palpitations and leg swelling.  Gastrointestinal:  Negative for abdominal pain and blood in stool (No melena).       Has had 2 episodes of nausea and vomiting since starting Ozempic .    Musculoskeletal:        Swelling of hands bilaterally working with a company  where form [employer].  Her hands are swelling.  Her ring and thmbs are also triggering.  Started in February about 1 month after starting this new job in January.  States she does not work on weekends, but her hand swelling does not improve.  Difficulty also flexing fingers fully due to swelling and discomfort.  No erythema.   No other joints bother her.  Neurological:  Positive for numbness (Feet better, but does have burning and numbness on ulnar aspect of hands and forearms.  Notes this with formed plastics.).      Objective:   BP 112/60 (BP Location: Left Arm, Patient Position: Sitting, Cuff Size: Normal)   Pulse 79   Resp 17   Ht 5' 2 (1.575 m)   Wt 171 lb (77.6 kg)   LMP 12/03/2023   BMI 31.28 kg/m   Physical Exam Constitutional:      Appearance: She is obese.  HENT:     Head: Normocephalic and atraumatic.     Right Ear: Tympanic membrane, ear canal and external ear normal.     Left Ear: Tympanic membrane, ear canal and external ear normal.     Nose: Nose normal.     Mouth/Throat:     Mouth: Mucous membranes are moist.     Pharynx: Oropharynx is clear.  Eyes:     Extraocular Movements: Extraocular movements intact.     Conjunctiva/sclera: Conjunctivae normal.     Pupils: Pupils are equal, round, and reactive to light.     Comments: Discs appear sharp  Neck:     Thyroid: No thyroid mass or thyromegaly.  Cardiovascular:     Rate and Rhythm: Normal rate and regular rhythm.     Pulses:          Dorsalis pedis pulses are 2+ on the right side and 2+ on the left side.       Posterior tibial pulses are 2+ on the right side and 2+ on the left side.     Heart sounds: S1 normal and S2 normal. No murmur heard.    No friction rub. No S3 or S4 sounds.     Comments: No carotid bruits.  Carotid, radial, femoral, DP and PT pulses normal and equal.   Pulmonary:     Effort: Pulmonary effort is normal.     Breath sounds: Normal breath sounds and air entry.  Chest:  Breasts:     Right: No inverted nipple, mass or nipple discharge.     Left: No  inverted nipple, mass or nipple discharge.  Abdominal:     General: Bowel sounds are normal.     Palpations: Abdomen is soft. There is no hepatomegaly, splenomegaly or mass.     Tenderness: There is no abdominal tenderness.     Hernia: No hernia is present.  Genitourinary:    Comments: Normal external female genitalia No cervical lesions No uterine or adnexal mass or tenderness.  Musculoskeletal:     Cervical back: Normal range of motion and neck supple.     Right lower leg: No edema.     Left lower leg: No edema.     Comments: Diffuse soft tissue swelling of all digits, not isolated to joints.   Soft tissue swelling of dorsal hands as well.  Unable to make tight fist due to swelling.   Bilateral ring and thumbs trigger and patient states other fingers do as well, though unable to demonstrate today.     Feet:     Right foot:     Protective Sensation: 10 sites tested.  10 sites sensed.     Skin integrity: Skin integrity normal.     Left foot:     Protective Sensation: 10 sites tested.  10 sites sensed.     Skin integrity: Skin integrity normal.  Lymphadenopathy:     Head:     Right side of head: No submental or submandibular adenopathy.     Left side of head: No submental or submandibular adenopathy.     Cervical: No cervical adenopathy.     Upper Body:     Right upper body: No supraclavicular or axillary adenopathy.     Left upper body: No supraclavicular or axillary adenopathy.     Lower Body: No right inguinal adenopathy. No left inguinal adenopathy.  Skin:    General: Skin is warm.     Capillary Refill: Capillary refill takes less than 2 seconds.     Findings: No rash.     Comments: Large areas of face with sharply defined lack of pigment.  Neurological:     General: No focal deficit present.     Mental Status: She is alert and oriented to person, place, and time.     Cranial Nerves: Cranial nerves 2-12  are intact.     Sensory: Sensation is intact.     Motor: Motor function is intact.     Coordination: Coordination is intact.     Gait: Gait is intact.     Deep Tendon Reflexes: Reflexes are normal and symmetric.     Comments: + Tinels over median nerve bilaterally--volar wrist.  Psychiatric:        Mood and Affect: Mood normal.        Speech: Speech normal.        Behavior: Behavior normal. Behavior is cooperative.      Assessment & Plan   CPE with pap Mammogram and screening colonoscopy scheduled.   Pneumococcal 20 Encouraged influenza and COVID in Sept/Oct.  2.  DM:  improved control with Ozempic , but follow up to see if her sugars were dropping to wean other meds not good.  Check A1C to see if can wean glipizide  and then Metformin .  Urine microalbumin/crea as well.    3.  Hypertension:  controlled  4.  Hyperlipidemia:  FLP, CMP  5.  Soft tissue swelling of hands with new job:  appears to be generalized swelling, but check ANA, Sed Rate, RF.    6.  Bilateral Carpal Tunnel Syndrome:  urged her to use her cock up splints nightly, keep hands on top of covers and keep BR temp 65-68.    7.  Multiple trigger fingers with new job as well:  control DM and referral to Hand surgery as so many digits involved.    8.  Diabetic retinal disease:  referral back to Dr. Valdemar.  Encouraged consistency with follow up  9.  Hypothyroidism:  TSH.    10.  Vitiligo, mainly involving face:  Referral to Derm to discuss newer options for treatment.    11.  By the way:  having headaches that come on suddenly and last for just a minute or less with intercourse.  Will get her back in to discuss at length.  This was brought up at end of visit.

## 2024-02-16 LAB — LIPID PANEL W/O CHOL/HDL RATIO
Cholesterol, Total: 188 mg/dL (ref 100–199)
HDL: 57 mg/dL (ref >39)
LDL Chol Calc (NIH): 109 mg/dL — ABNORMAL HIGH (ref 0–99)
Triglycerides: 127 mg/dL (ref 0–149)
VLDL Cholesterol Cal: 22 mg/dL (ref 5–40)

## 2024-02-16 LAB — CBC WITH DIFFERENTIAL/PLATELET
Basophils Absolute: 0 x10E3/uL (ref 0.0–0.2)
Basos: 0 %
EOS (ABSOLUTE): 0.1 x10E3/uL (ref 0.0–0.4)
Eos: 2 %
Hematocrit: 44.3 % (ref 34.0–46.6)
Hemoglobin: 14.3 g/dL (ref 11.1–15.9)
Immature Grans (Abs): 0 x10E3/uL (ref 0.0–0.1)
Immature Granulocytes: 0 %
Lymphocytes Absolute: 1.8 x10E3/uL (ref 0.7–3.1)
Lymphs: 26 %
MCH: 30.9 pg (ref 26.6–33.0)
MCHC: 32.3 g/dL (ref 31.5–35.7)
MCV: 96 fL (ref 79–97)
Monocytes Absolute: 0.3 x10E3/uL (ref 0.1–0.9)
Monocytes: 5 %
Neutrophils Absolute: 4.5 x10E3/uL (ref 1.4–7.0)
Neutrophils: 67 %
Platelets: 210 x10E3/uL (ref 150–450)
RBC: 4.63 x10E6/uL (ref 3.77–5.28)
RDW: 12.5 % (ref 11.7–15.4)
WBC: 6.8 x10E3/uL (ref 3.4–10.8)

## 2024-02-16 LAB — CYTOLOGY - PAP

## 2024-02-16 LAB — COMPREHENSIVE METABOLIC PANEL WITH GFR
ALT: 23 IU/L (ref 0–32)
AST: 19 IU/L (ref 0–40)
Albumin: 4.1 g/dL (ref 3.9–4.9)
Alkaline Phosphatase: 98 IU/L (ref 44–121)
BUN/Creatinine Ratio: 21 (ref 9–23)
BUN: 13 mg/dL (ref 6–24)
Bilirubin Total: 0.4 mg/dL (ref 0.0–1.2)
CO2: 22 mmol/L (ref 20–29)
Calcium: 9 mg/dL (ref 8.7–10.2)
Chloride: 102 mmol/L (ref 96–106)
Creatinine, Ser: 0.62 mg/dL (ref 0.57–1.00)
Globulin, Total: 2.5 g/dL (ref 1.5–4.5)
Glucose: 151 mg/dL — ABNORMAL HIGH (ref 70–99)
Potassium: 4.5 mmol/L (ref 3.5–5.2)
Sodium: 138 mmol/L (ref 134–144)
Total Protein: 6.6 g/dL (ref 6.0–8.5)
eGFR: 111 mL/min/1.73 (ref >59)

## 2024-02-16 LAB — TSH: TSH: 0.075 u[IU]/mL — ABNORMAL LOW (ref 0.450–4.500)

## 2024-02-16 LAB — RHEUMATOID FACTOR: Rheumatoid fact SerPl-aCnc: <10 [IU]/mL (ref <14.0)

## 2024-02-16 LAB — SPECIMEN STATUS

## 2024-02-16 LAB — FANA STAINING PATTERNS: Homogeneous Pattern: 1:640 {titer} — ABNORMAL HIGH

## 2024-02-16 LAB — MICROALBUMIN / CREATININE URINE RATIO

## 2024-02-16 LAB — HGB A1C W/O EAG: Hgb A1c MFr Bld: 8 % — ABNORMAL HIGH (ref 4.8–5.6)

## 2024-02-16 LAB — SPECIMEN STATUS REPORT

## 2024-02-16 LAB — ANTINUCLEAR ANTIBODIES, IFA: ANA Titer 1: POSITIVE — AB

## 2024-02-16 LAB — SEDIMENTATION RATE: Sed Rate: 3 mm/h (ref 0–32)

## 2024-02-19 ENCOUNTER — Other Ambulatory Visit: Payer: Self-pay

## 2024-02-19 ENCOUNTER — Ambulatory Visit: Payer: Self-pay | Admitting: Internal Medicine

## 2024-02-19 DIAGNOSIS — M7989 Other specified soft tissue disorders: Secondary | ICD-10-CM | POA: Insufficient documentation

## 2024-02-19 DIAGNOSIS — M65341 Trigger finger, right ring finger: Secondary | ICD-10-CM | POA: Insufficient documentation

## 2024-02-19 NOTE — Progress Notes (Signed)
 Spoke with pharmacy staff... Pt. Has picked up all DM medications in pass month, dose was verified.

## 2024-02-19 NOTE — Progress Notes (Signed)
 Spoke with [facility]... States ENA can be added. Order number for this panel is [id number].No mirco-albumin urine was done ,urine was not collected.No other orders are pending.

## 2024-03-06 ENCOUNTER — Ambulatory Visit
Admission: RE | Admit: 2024-03-06 | Discharge: 2024-03-06 | Disposition: A | Discharge status: Home / Self Care | Payer: Self-pay | Source: Ambulatory Visit | Attending: Internal Medicine | Admitting: Internal Medicine

## 2024-03-06 DIAGNOSIS — Z1231 Encounter for screening mammogram for malignant neoplasm of breast: Secondary | ICD-10-CM

## 2024-04-02 ENCOUNTER — Ambulatory Visit: Payer: Self-pay | Admitting: Internal Medicine

## 2024-04-03 ENCOUNTER — Other Ambulatory Visit: Payer: Self-pay

## 2024-04-03 MED ORDER — SEMAGLUTIDE (1 MG/DOSE) 4 MG/3ML ~~LOC~~ SOPN: 1.0000 mg | PEN_INJECTOR | SUBCUTANEOUS | 11 refills | Status: AC

## 2024-04-11 ENCOUNTER — Other Ambulatory Visit: Payer: Self-pay | Admitting: Internal Medicine

## 2024-05-28 ENCOUNTER — Other Ambulatory Visit: Payer: Self-pay | Admitting: Internal Medicine

## 2024-05-28 DIAGNOSIS — Z1211 Encounter for screening for malignant neoplasm of colon: Secondary | ICD-10-CM

## 2024-05-28 NOTE — Progress Notes (Signed)
 Do not see a referral placed with her CPE for screening colonoscopy in August.  Order placed today

## 2024-07-27 ENCOUNTER — Other Ambulatory Visit: Payer: Self-pay

## 2024-07-27 ENCOUNTER — Ambulatory Visit
Admission: RE | Admit: 2024-07-27 | Discharge: 2024-07-27 | Disposition: A | Discharge status: Home / Self Care | Attending: Physician Assistant

## 2024-07-27 VITALS — BP 138/84 | HR 74 | Temp 98.1°F | Resp 16 | Ht 62.0 in | Wt 174.0 lb

## 2024-07-27 DIAGNOSIS — G5603 Carpal tunnel syndrome, bilateral upper limbs: Secondary | ICD-10-CM | POA: Diagnosis not present

## 2024-07-27 DIAGNOSIS — M654 Radial styloid tenosynovitis [de Quervain]: Secondary | ICD-10-CM

## 2024-07-27 MED ORDER — METHOCARBAMOL 500 MG PO TABS: 500.0000 mg | ORAL_TABLET | Freq: Two times a day (BID) | ORAL | 0 refills | Status: AC

## 2024-07-27 MED ORDER — DICLOFENAC SODIUM 75 MG PO TBEC: 75.0000 mg | DELAYED_RELEASE_TABLET | Freq: Two times a day (BID) | ORAL | 0 refills | Status: AC

## 2024-07-27 NOTE — ED Triage Notes (Addendum)
 Pt presents with a chief complaint of left wrist pain x 6 months. Pain only occurs with movement of the wrist + carrying objects. Currently denies pain. No known injuries to left upper extremity. Full ROM noted in left wrist. No medications/interventions taken PTA for pain reported.

## 2024-07-27 NOTE — Discharge Instructions (Addendum)
 You were seen today for concerns of left wrist pain.  At this time I suspect that you have some inflammation in the soft tissues and tendons on both sides of the wrist.  Your exam also shows evidence of carpal tunnel syndrome.  To assist with your symptoms I am sending in a prescription for an NSAID medication called diclofenac .  You can take this twice per day as needed to assist with your discomfort and inflammation.  You can also take Tylenol  with this medication.  Since diclofenac  is an NSAID medication please do not take other NSAIDs along with it as this can cause GI irritation (this would include medication such as ibuprofen , Aleve, Motrin , naproxen). Also sending in a muscle relaxer called Robaxin  that you can take up to every 8 hours.  Please be advised that this medication can cause drowsiness and sedation so do not take it if you need to remain alert or drive.  I do recommend that you follow-up with orthopedics for further evaluation especially for carpal tunnel syndrome as this can cause further irritation if not treated appropriately

## 2024-07-27 NOTE — ED Provider Notes (Signed)
 " [facility]    CSN: [id number] Arrival date & time: 07/27/24  1150      History   Chief Complaint Chief Complaint  Patient presents with   Wrist Pain    Left hand - Entered by patient    HPI [name] is a 49 y.o. female.   HPI  Pt is here today with concerns of left wrist pain. She states this has been ongoing for about 6 months. She states lately she is getting spasms of pain which prompted her to come in today. She denies injuries or trauma to the area. She does admit that her work requires her to have ongoing repetitive movements which may be contributing.  She states sometimes the pain becomes more sharp and severe, other times it is burning in nature. She reports some difficulty holding heavy items with that hand.  Interventions: none so far.  Alleviations: holding her wrist with her other hand tightly (compressing the area)  She does admit to having some numbness and tingling in both wrists in the morning but states this was more prevalent in the past.    Past Medical History:  Diagnosis Date   Diabetes mellitus    Hyperlipidemia    Thyroid disease    Vitiligo     Patient Active Problem List   Diagnosis Date Noted   Acquired trigger finger of both ring fingers 02/19/2024   Bilateral hand swelling 02/19/2024   Uncontrolled type 2 diabetes mellitus with hyperglycemia, without long-term current use of insulin  (HCC) 04/05/2023   Mixed hyperlipidemia 04/05/2023   Hypertension 07/28/2022   Diabetic peripheral neuropathy (HCC) 06/18/2019   Elevated BP without diagnosis of hypertension 06/18/2019   Vitiligo 01/24/2018   Trigger thumb of both thumbs 08/18/2015   IUD (intrauterine device) in place 03/26/2015   Carpal tunnel syndrome 03/26/2015   Chronic cough 05/13/2011   Elevated liver enzymes 12/14/2010   Obesity 06/25/2007   Non-alcoholic fatty liver disease 06/25/2007   Hypothyroidism 04/09/2007   Diabetes mellitus (HCC) 04/09/2007    Hypercholesterolemia 04/09/2007    Past Surgical History:  Procedure Laterality Date   CESAREAN SECTION  2011    OB History   No obstetric history on file.      Home Medications    Prior to Admission medications  Medication Sig Start Date End Date Taking? Authorizing Provider  diclofenac  (VOLTAREN ) 75 MG EC tablet Take 1 tablet (75 mg total) by mouth 2 (two) times daily. 07/27/24  Yes Argelia Formisano E, PA-C  methocarbamol  (ROBAXIN ) 500 MG tablet Take 1 tablet (500 mg total) by mouth 2 (two) times daily. 07/27/24  Yes Baneen Wieseler E, PA-C  AgaMatrix Ultra-Thin Lancets MISC Check blood sugar before meal twice daily 11/30/23   Adella Norris, MD  atorvastatin  (LIPITOR) 40 MG tablet TAKE 1 & 1/2 (ONE & ONE-HALF) TABLETS BY MOUTH ONCE DAILY 09/18/23   Adella Norris, MD  Blood Glucose Monitoring Suppl (AGAMATRIX PRESTO) w/Device KIT Check blood sugar twice daily before meals 08/18/23   Adella Norris, MD  empagliflozin  (JARDIANCE ) 10 MG TABS tablet Take 1 tablet (10 mg total) by mouth daily before breakfast. 11/29/23   Adella Norris, MD  glipiZIDE  (GLUCOTROL ) 10 MG tablet Take 1 tablet by mouth twice daily with food 04/11/24   Adella Norris, MD  glucose blood (AGAMATRIX PRESTO TEST) test strip Check blood sugar twice daily before meals 12/04/23   Adella Norris, MD  Insulin  Pen Needle 31G X 6 MM MISC Use for once daily injection  12/04/19   Adella Norris, MD  levonorgestrel (MIRENA) 20 MCG/24HR IUD 1 each by Intrauterine route once. Placed 07/2017.  States to be removed 2026    [provider]  levothyroxine  (SYNTHROID ) 150 MCG tablet Take 1 tablet by mouth once daily 04/11/24   Adella Norris, MD  losartan  (COZAAR ) 50 MG tablet Take 1 tablet by mouth once daily 04/11/24   Adella Norris, MD  metFORMIN  (GLUCOPHAGE ) 1000 MG tablet TAKE 1 TABLET BY MOUTH TWICE DAILY WITH A MEAL 04/11/24   Adella Norris, MD  Semaglutide , 1 MG/DOSE, 4 MG/3ML SOPN  Inject 1 mg into the skin once a week. 04/03/24   Adella Norris, MD  terbinafine  (LAMISIL ) 1 % cream Apply 1 Application topically 2 (two) times daily. Patient not taking: Reported on 02/15/2024 04/05/23   Adella Norris, MD    Family History Family History  Problem Relation Age of Onset   Alcohol abuse Mother    Alcohol abuse Father    Diabetes Father    Cirrhosis Father    Kidney disease Sister        ESRD on dialysis at time of death   Hyperlipidemia Sister    Hypertension Sister    Diabetes Sister        complications cause of death   Vision loss Sister        DM retinopathy   Pneumonia Brother     Social History Social History[1]   Allergies   Ace inhibitors   Review of Systems Review of Systems  Musculoskeletal:        Left wrist pain      Physical Exam Triage Vital Signs ED Triage Vitals  Encounter Vitals Group     BP 07/27/24 1217 138/84     Girls Systolic BP Percentile --      Girls Diastolic BP Percentile --      Boys Systolic BP Percentile --      Boys Diastolic BP Percentile --      Pulse Rate 07/27/24 1217 74     Resp 07/27/24 1217 16     Temp 07/27/24 1217 98.1 F (36.7 C)     Temp Source 07/27/24 1217 Oral     SpO2 07/27/24 1217 98 %     Weight 07/27/24 1217 174 lb (78.9 kg)     Height 07/27/24 1217 5' 2 (1.575 m)     Head Circumference --      Peak Flow --      Pain Score 07/27/24 1236 0     Pain Loc --      Pain Education --      Exclude from Growth Chart --    No data found.  Updated Vital Signs BP 138/84 (BP Location: Left Arm)   Pulse 74   Temp 98.1 F (36.7 C) (Oral)   Resp 16   Ht 5' 2 (1.575 m)   Wt 174 lb (78.9 kg)   SpO2 98%   BMI 31.83 kg/m   Visual Acuity Right Eye Distance:   Left Eye Distance:   Bilateral Distance:    Right Eye Near:   Left Eye Near:    Bilateral Near:     Physical Exam Vitals reviewed.  Constitutional:      General: She is awake.     Appearance: Normal appearance. She is  well-developed and well-groomed.  HENT:     Head: Normocephalic and atraumatic.  Eyes:     General: Lids are normal. Gaze aligned appropriately.  Extraocular Movements: Extraocular movements intact.     Conjunctiva/sclera: Conjunctivae normal.  Cardiovascular:     Pulses:          Radial pulses are 2+ on the right side and 2+ on the left side.  Pulmonary:     Effort: Pulmonary effort is normal.  Musculoskeletal:     Right wrist: Normal. No swelling, deformity, tenderness, bony tenderness or snuff box tenderness. Normal range of motion. Normal pulse.     Left wrist: Tenderness present. No swelling, deformity, bony tenderness or snuff box tenderness. Normal range of motion. Normal pulse.     Right hand: No swelling, deformity, tenderness or bony tenderness. Normal range of motion. Normal strength. Normal capillary refill. Normal pulse.     Left hand: No swelling, tenderness or bony tenderness. Normal range of motion. Normal strength. Normal capillary refill. Normal pulse.     Comments: Positive phalens testing bilaterally  Pt reports pain along the ulnar aspect of the wrist and forearm with Terrilee testing  She has left wrist pain with radial lateral flexion    Neurological:     Mental Status: She is alert and oriented to person, place, and time.  Psychiatric:        Attention and Perception: Attention and perception normal.        Mood and Affect: Mood and affect normal.        Speech: Speech normal.        Behavior: Behavior normal. Behavior is cooperative.      UC Treatments / Results  Labs (all labs ordered are listed, but only abnormal results are displayed) Labs Reviewed - No data to display  EKG   Radiology No results found.  Procedures Procedures (including critical care time)  Medications Ordered in UC Medications - No data to display  Initial Impression / Assessment and Plan / UC Course  I have reviewed the triage vital signs and the nursing  notes.  Pertinent labs & imaging results that were available during my care of the patient were reviewed by me and considered in my medical decision making (see chart for details).      Final Clinical Impressions(s) / UC Diagnoses   Final diagnoses:  Bilateral carpal tunnel syndrome  De Quervain's tenosynovitis, left  Patient is here today with concerns of left wrist pain has been ongoing for about 6 months.  Physical exam is notable for positive Finkelstein's testing on the left and positive Phalen's testing bilaterally.  Based on her symptoms I suspect tendon inflammation and potential carpal tunnel syndrome.  Will start with a wrist brace as well as diclofenac  p.o. twice daily as needed and methocarbamol .  Reviewed that she should not use other NSAID medications while taking diclofenac  and that Robaxin  can cause drowsiness and sedation so she should not take it if she needs to remain alert to drive.  I did recommend that she follows up with orthopedics for ongoing evaluation and management as needed.  List of nearby orthopedic offices included in paperwork.  Follow-up as needed   Discharge Instructions      You were seen today for concerns of left wrist pain.  At this time I suspect that you have some inflammation in the soft tissues and tendons on both sides of the wrist.  Your exam also shows evidence of carpal tunnel syndrome.  To assist with your symptoms I am sending in a prescription for an NSAID medication called diclofenac .  You can take this twice per day as  needed to assist with your discomfort and inflammation.  You can also take Tylenol  with this medication.  Since diclofenac  is an NSAID medication please do not take other NSAIDs along with it as this can cause GI irritation (this would include medication such as ibuprofen , Aleve, Motrin , naproxen). Also sending in a muscle relaxer called Robaxin  that you can take up to every 8 hours.  Please be advised that this medication can  cause drowsiness and sedation so do not take it if you need to remain alert or drive.  I do recommend that you follow-up with orthopedics for further evaluation especially for carpal tunnel syndrome as this can cause further irritation if not treated appropriately     ED Prescriptions     Medication Sig Dispense Auth. Provider   diclofenac  (VOLTAREN ) 75 MG EC tablet Take 1 tablet (75 mg total) by mouth 2 (two) times daily. 30 tablet Davius Goudeau E, PA-C   methocarbamol  (ROBAXIN ) 500 MG tablet Take 1 tablet (500 mg total) by mouth 2 (two) times daily. 20 tablet Falicia Lizotte E, PA-C      PDMP not reviewed this encounter.     [1]  Social History Tobacco Use   Smoking status: Former    Current packs/day: 0.00    Average packs/day: (0.1 ttl pk-yrs)    Types: Cigarettes    Start date: 52    Quit date: 2002    Years since quitting: 24.0    Passive exposure: Past   Smokeless tobacco: Never  Vaping Use   Vaping status: Never Used  Substance Use Topics   Alcohol use: No   Drug use: Never     Nasean Zapf, Rocky BRAVO, PA-C 07/27/24 1334  "

## 2024-08-20 ENCOUNTER — Other Ambulatory Visit

## 2024-08-22 ENCOUNTER — Ambulatory Visit: Admitting: Internal Medicine
